# Patient Record
Sex: Female | Born: 1980 | Race: White | Hispanic: No | Marital: Married | Smoking: Never smoker
Health system: Southern US, Community
[De-identification: ages and names within clinical notes are randomized; demographics above are authoritative.]

## PROBLEM LIST (undated history)

## (undated) ENCOUNTER — Inpatient Hospital Stay (HOSPITAL_COMMUNITY): Payer: Self-pay

## (undated) DIAGNOSIS — E282 Polycystic ovarian syndrome: Secondary | ICD-10-CM

## (undated) DIAGNOSIS — F419 Anxiety disorder, unspecified: Secondary | ICD-10-CM

## (undated) DIAGNOSIS — E739 Lactose intolerance, unspecified: Secondary | ICD-10-CM

## (undated) DIAGNOSIS — Z9889 Other specified postprocedural states: Secondary | ICD-10-CM

## (undated) DIAGNOSIS — R112 Nausea with vomiting, unspecified: Secondary | ICD-10-CM

## (undated) HISTORY — DX: Polycystic ovarian syndrome: E28.2

## (undated) HISTORY — DX: Other specified postprocedural states: Z98.890

## (undated) HISTORY — DX: Lactose intolerance, unspecified: E73.9

## (undated) HISTORY — PX: OTHER SURGICAL HISTORY: SHX169

## (undated) HISTORY — DX: Nausea with vomiting, unspecified: R11.2

---

## 2003-06-10 HISTORY — PX: TONSILLECTOMY: SUR1361

## 2005-06-09 HISTORY — PX: BREAST ENHANCEMENT SURGERY: SHX7

## 2007-06-10 HISTORY — PX: CHOLECYSTECTOMY: SHX55

## 2007-06-10 HISTORY — PX: BREAST BIOPSY: SHX20

## 2011-04-14 ENCOUNTER — Ambulatory Visit (INDEPENDENT_AMBULATORY_CARE_PROVIDER_SITE_OTHER): Payer: No Typology Code available for payment source | Admitting: *Deleted

## 2011-04-14 DIAGNOSIS — O30009 Twin pregnancy, unspecified number of placenta and unspecified number of amniotic sacs, unspecified trimester: Secondary | ICD-10-CM

## 2011-04-14 DIAGNOSIS — Z3480 Encounter for supervision of other normal pregnancy, unspecified trimester: Secondary | ICD-10-CM

## 2011-04-14 NOTE — Progress Notes (Signed)
p-88 Pt is referred here for [facility] care from Dr Elesa Hacker.  She underwent 6 cycles of Femara and ultimately conceived on the 6 cycle.  Bedside u/s today showed twins A and B both with FHT.  Baby A measures39.82mm GA [redacted]w[redacted]d and FHT162bpm.  Baby B is 43.36mm GA [redacted]w[redacted]d and FHT 162bpm.  Labs were deferred today due to pending insurance.  She is scheduled to return in 1 week for her prenatal exam.

## 2011-04-25 ENCOUNTER — Ambulatory Visit (INDEPENDENT_AMBULATORY_CARE_PROVIDER_SITE_OTHER): Payer: Medicaid Other | Admitting: Family

## 2011-04-25 ENCOUNTER — Other Ambulatory Visit: Payer: Self-pay | Admitting: Family

## 2011-04-25 ENCOUNTER — Other Ambulatory Visit (HOSPITAL_COMMUNITY)
Admission: RE | Admit: 2011-04-25 | Discharge: 2011-04-25 | Disposition: A | Discharge status: Home / Self Care | Payer: Medicaid Other | Source: Ambulatory Visit | Attending: Obstetrics & Gynecology | Admitting: Obstetrics & Gynecology

## 2011-04-25 ENCOUNTER — Encounter: Payer: Self-pay | Admitting: Family

## 2011-04-25 VITALS — BP 105/70 | Temp 98.6°F | Wt 117.0 lb

## 2011-04-25 DIAGNOSIS — Z113 Encounter for screening for infections with a predominantly sexual mode of transmission: Secondary | ICD-10-CM | POA: Insufficient documentation

## 2011-04-25 DIAGNOSIS — Z1159 Encounter for screening for other viral diseases: Secondary | ICD-10-CM | POA: Insufficient documentation

## 2011-04-25 DIAGNOSIS — Z3490 Encounter for supervision of normal pregnancy, unspecified, unspecified trimester: Secondary | ICD-10-CM

## 2011-04-25 DIAGNOSIS — Z3480 Encounter for supervision of other normal pregnancy, unspecified trimester: Secondary | ICD-10-CM

## 2011-04-25 DIAGNOSIS — Z01419 Encounter for gynecological examination (general) (routine) without abnormal findings: Secondary | ICD-10-CM | POA: Insufficient documentation

## 2011-04-25 NOTE — Progress Notes (Signed)
Concerned with weight gain in pregnancy; only gained 1 lb; wait two weeks if no additional gain will meet with the nutritionalist.  Schedule dating ultrasound. Exam    Uterine Size: Uterus measures 16cm; appropriate for twin gestation  Pelvic Exam:    Perineum: No Hemorrhoids, Normal Perineum   Vulva: normal   Vagina:  normal mucosa, normal discharge, no palpable nodules   pH: Not done   Cervix: no bleeding following Pap, no cervical motion tenderness and no lesions   Adnexa: normal adnexa and no mass, fullness, tenderness   Bony Pelvis: Adequate  System: Breast:  No nipple retraction or dimpling, No nipple discharge or bleeding, No axillary or supraclavicular adenopathy, Normal to palpation without dominant masses   Skin: normal coloration and turgor, no rashes    Neurologic: negative   Extremities: normal strength, tone, and muscle mass   HEENT neck supple with midline trachea and thyroid without masses   Mouth/Teeth mucous membranes moist, pharynx normal without lesions   Neck supple and no masses   Cardiovascular: regular rate and rhythm, no murmurs or gallops   Respiratory:  appears well, vitals normal, no respiratory distress, acyanotic, normal RR, neck free of mass or lymphadenopathy, chest clear, no wheezing, crepitations, rhonchi, normal symmetric air entry   Abdomen: soft, non-tender; bowel sounds normal; no masses,  no organomegaly   Urinary: urethral meatus normal

## 2011-04-25 NOTE — Progress Notes (Signed)
p-82 Pt here for NOB exam  Needs PNL drawn today

## 2011-04-26 LAB — OBSTETRIC PANEL
Hemoglobin: 12.1 g/dL (ref 12.0–15.0)
Hepatitis B Surface Ag: NEGATIVE
Lymphs Abs: 1.2 10*3/uL (ref 0.7–4.0)
Monocytes Relative: 6 % (ref 3–12)
Neutro Abs: 6.5 10*3/uL (ref 1.7–7.7)
Neutrophils Relative %: 79 % — ABNORMAL HIGH (ref 43–77)
Platelets: 235 10*3/uL (ref 150–400)
RBC: 3.81 MIL/uL — ABNORMAL LOW (ref 3.87–5.11)
Rh Type: POSITIVE
WBC: 8.2 10*3/uL (ref 4.0–10.5)

## 2011-04-26 LAB — PROGESTERONE: Progesterone: 75.3 ng/mL

## 2011-04-27 LAB — CULTURE, URINE COMPREHENSIVE: Colony Count: 10000

## 2011-04-29 ENCOUNTER — Other Ambulatory Visit: Payer: Self-pay | Admitting: Family

## 2011-04-29 ENCOUNTER — Ambulatory Visit (HOSPITAL_COMMUNITY)
Admission: RE | Admit: 2011-04-29 | Discharge: 2011-04-29 | Disposition: A | Discharge status: Home / Self Care | Payer: Medicaid Other | Source: Ambulatory Visit | Attending: Obstetrics & Gynecology | Admitting: Obstetrics & Gynecology

## 2011-04-29 DIAGNOSIS — Z3689 Encounter for other specified antenatal screening: Secondary | ICD-10-CM | POA: Insufficient documentation

## 2011-04-29 DIAGNOSIS — Z3490 Encounter for supervision of normal pregnancy, unspecified, unspecified trimester: Secondary | ICD-10-CM

## 2011-04-29 DIAGNOSIS — O4400 Placenta previa specified as without hemorrhage, unspecified trimester: Secondary | ICD-10-CM | POA: Insufficient documentation

## 2011-05-23 ENCOUNTER — Encounter: Payer: Self-pay | Admitting: Family

## 2011-05-23 ENCOUNTER — Ambulatory Visit (INDEPENDENT_AMBULATORY_CARE_PROVIDER_SITE_OTHER): Payer: Medicaid Other | Admitting: Family

## 2011-05-23 DIAGNOSIS — O4400 Placenta previa specified as without hemorrhage, unspecified trimester: Secondary | ICD-10-CM

## 2011-05-23 DIAGNOSIS — O30049 Twin pregnancy, dichorionic/diamniotic, unspecified trimester: Secondary | ICD-10-CM

## 2011-05-23 NOTE — Progress Notes (Signed)
Pt with di/di twins; no problems or concerns; reviewed ultrasound with her (low lying placenta versus previa twin B); denies any vaginal bleeding; recommended no intercourse until reeval and to report any bleeding ASAP.  Schedule anatomy US in two weeks.

## 2011-05-23 NOTE — Progress Notes (Signed)
p-86 Pt does have twins and already noticing that she is out of breath when walking. Small spot of blood after intercourse.

## 2011-05-26 ENCOUNTER — Telehealth: Payer: Self-pay | Admitting: *Deleted

## 2011-05-26 NOTE — Telephone Encounter (Signed)
Pt was offered and declined any genetic screening.

## 2011-06-10 NOTE — L&D Delivery Note (Signed)
  Jillian Mcguire, Jillian Mcguire [[id number]]  Delivery Note At 12:32 PM a viable female was delivered via Vaginal, Spontaneous Delivery (Presentation: Left Occiput Anterior).  APGAR: 9, 9; weight 6 lb 10.5 oz (3019 g).   Placenta status: still in utero when pt tx to OR  Cord: 3 vessels  After del of Twin A, vtx verified by bedside US (Dr Debroah Loop). Soon afterwards pt feeling some vag pressure. With pushing, her membranes rupt spontaneously for clear fluid. Initially there was no cord palpated, but with the subsequent ctx there was a fetal heart rate drop verified (using pulse ox on pt), and at that point a loop of cord was palpated and Dr Debroah Loop to eval.   Anesthesia: Epidural  Episiotomy: none Lacerations: 2nd degree;Perineal Suture Repair: see note by Dr Delanna Ahmadi. Blood Loss (mL): 900 (300cc in room with del of Twin A)  Mom to OR.  Baby to nursery-stable.  Cam Hai 10/07/2011, 2:24 PM

## 2011-06-11 ENCOUNTER — Ambulatory Visit (HOSPITAL_COMMUNITY)
Admission: RE | Admit: 2011-06-11 | Discharge: 2011-06-11 | Disposition: A | Discharge status: Home / Self Care | Payer: Medicaid Other | Source: Ambulatory Visit | Attending: Obstetrics and Gynecology | Admitting: Obstetrics and Gynecology

## 2011-06-11 DIAGNOSIS — O358XX0 Maternal care for other (suspected) fetal abnormality and damage, not applicable or unspecified: Secondary | ICD-10-CM | POA: Insufficient documentation

## 2011-06-11 DIAGNOSIS — O4400 Placenta previa specified as without hemorrhage, unspecified trimester: Secondary | ICD-10-CM | POA: Insufficient documentation

## 2011-06-11 DIAGNOSIS — O34219 Maternal care for unspecified type scar from previous cesarean delivery: Secondary | ICD-10-CM | POA: Insufficient documentation

## 2011-06-11 DIAGNOSIS — O30049 Twin pregnancy, dichorionic/diamniotic, unspecified trimester: Secondary | ICD-10-CM

## 2011-06-11 DIAGNOSIS — Z1389 Encounter for screening for other disorder: Secondary | ICD-10-CM | POA: Insufficient documentation

## 2011-06-11 DIAGNOSIS — Z363 Encounter for antenatal screening for malformations: Secondary | ICD-10-CM | POA: Insufficient documentation

## 2011-06-11 DIAGNOSIS — O30009 Twin pregnancy, unspecified number of placenta and unspecified number of amniotic sacs, unspecified trimester: Secondary | ICD-10-CM | POA: Insufficient documentation

## 2011-06-17 ENCOUNTER — Ambulatory Visit (HOSPITAL_COMMUNITY): Payer: Medicaid Other

## 2011-06-18 ENCOUNTER — Ambulatory Visit (HOSPITAL_COMMUNITY): Payer: Medicaid Other

## 2011-06-20 ENCOUNTER — Ambulatory Visit (INDEPENDENT_AMBULATORY_CARE_PROVIDER_SITE_OTHER): Payer: Medicaid Other | Admitting: Physician Assistant

## 2011-06-20 ENCOUNTER — Other Ambulatory Visit: Payer: Self-pay | Admitting: Physician Assistant

## 2011-06-20 ENCOUNTER — Encounter: Payer: Self-pay | Admitting: Physician Assistant

## 2011-06-20 DIAGNOSIS — IMO0002 Reserved for concepts with insufficient information to code with codable children: Secondary | ICD-10-CM

## 2011-06-20 DIAGNOSIS — IMO0001 Reserved for inherently not codable concepts without codable children: Secondary | ICD-10-CM

## 2011-06-20 DIAGNOSIS — O30049 Twin pregnancy, dichorionic/diamniotic, unspecified trimester: Secondary | ICD-10-CM

## 2011-06-20 DIAGNOSIS — O350XX0 Maternal care for (suspected) central nervous system malformation in fetus, not applicable or unspecified: Secondary | ICD-10-CM

## 2011-06-20 DIAGNOSIS — O3503X0 Maternal care for (suspected) central nervous system malformation or damage in fetus, choroid plexus cysts, not applicable or unspecified: Secondary | ICD-10-CM

## 2011-06-20 NOTE — Patient Instructions (Signed)
Breastfeeding, Twins or Multiples Mothers of twins or multiples might feel overwhelmed with the idea of breastfeeding more than one baby at a time. It is easier and less expensive to breastfeed twins than to bottle feed them. This is because you do not need to buy infant formula, wash bottles, buy mild soap, and fill the bottles for more than one baby when it is time to feed them. Human milk is especially important for twins, who are often small at birth and need all the advantages breast milk can provide. Breastfeeding also helps create a unique and special bond between the mother and each of her infants.  Mothers of multiples get more benefits from breastfeeding:  Your uterus contracts and returns to its original size faster. This is helpful because it has stretched even more than normal to hold more than one baby.   Hormones are released that relax the mother. This is helpful with the added stress of caring for more than one infant.   The mother often finds she is saving herself time and money, because there is no need to prepare formula or bottles. Your milk is available whenever your babies are ready to feed, at the right temperature, providing optimal nutrition.  If the babies are premature and unable to nurse, you can pump your breasts and freeze the milk until the babies are ready to feed at the breast. To stimulate a milk supply, your breasts need to be emptied at least 8 to 10 times in a 24 hour period. Ask a lactation specialist to help you choose an effective breast pump and to provide guidance in helping your babies latch onto, and feed from, the breast when they are ready. TO GET STARTED: Nurse as soon as possible after birth, and as often as the babies want to do so. This will stimulate your breasts to produce adequate amounts of milk. Mothers of twins almost always produce enough milk for both babies.  TIPS TO INCREASE SUCCESS:  Many mothers of multiples find it easiest to nurse the  babies together. However, if one of the infants is having difficulty latching or sucking, the mother may need to give that baby her full attention when it is time to feed.   Nursing two babies at the same time often gets easier as the babies get older and more experienced at latching onto the breast. Extra pillows under the mother's arms, legs, and under the babies can help this process.   Breastfeeding two babies at once may increase the mother's milk producing hormone (prolactin) levels, and boost her milk production. The more often the babies breastfeed effectively, the more milk the mother will produce.   Switch the babies from one side to the other at alternate feedings. For instance, if baby A feeds from the right breast and baby B feeds from the left breast, then at the next feeding, baby A should take the left breast and baby B the right breast. This ensures that both breasts get equal amounts of stimulation. It also uses the stronger sucking twin to increase the milk supply for the twin whose suck is weaker.   If one of the babies is having difficulty feeding, it may help to try breastfeeding him at the same time as his sibling. The baby with the stronger or more effective suck will stimulate the mother's milk to flow faster. This will encourage his twin to suck and swallow correctly.   It is important to avoid limiting the amount of time   each baby spends feeding at the breast. This allows both babies to obtain the fattier milk that is available at the end of the feeding, when the breast is emptier.   Avoiding bottles and pacifiers during the early weeks will encourage effective sucking patterns and help establish a good milk supply. You should not need supplements if you empty your breasts with each feeding.   A good latch for both infants is important in helping the babies empty the breast effectively, and for avoiding sore nipples. The most common cause of soreness is improper latch-on and  positioning.   In the early days, keep track of each infant's stools and wet diapers, to make sure each baby is getting enough milk. In the first 6 weeks, each baby should have 6 to 8 wet cloth diapers (5 to 6 disposable diapers) and 2 or more bowel movements per day.   There are several positions and holds that make it easier to nurse more than one baby at a time. Ask your nurse or lactation specialist to suggest tips on positioning.   If you know you are having twins, talk with a lactation consultant about more ways you can increase your success at breastfeeding.  Document Released: 09/23/2004 Document Revised: 02/05/2011 Document Reviewed: 04/12/2009 [employer] Patient Information 2012 [employer]. 

## 2011-06-20 NOTE — Progress Notes (Signed)
Discussed [place] results. Previa resolved on baby B. Baby B with BL CPC--->referral made to MFM for re-check

## 2011-06-20 NOTE — Progress Notes (Signed)
Declines QUAD screen

## 2011-06-20 NOTE — Progress Notes (Signed)
p-100  Having URI symptoms.  Feels like she is having symptoms of low blood sugar

## 2011-07-02 ENCOUNTER — Ambulatory Visit (HOSPITAL_COMMUNITY): Payer: Medicaid Other

## 2011-07-02 ENCOUNTER — Encounter (HOSPITAL_COMMUNITY): Payer: Self-pay

## 2011-07-02 ENCOUNTER — Ambulatory Visit (HOSPITAL_COMMUNITY)
Admission: RE | Admit: 2011-07-02 | Discharge: 2011-07-02 | Disposition: A | Discharge status: Home / Self Care | Payer: Medicaid Other | Source: Ambulatory Visit | Attending: Physician Assistant | Admitting: Physician Assistant

## 2011-07-02 DIAGNOSIS — IMO0001 Reserved for inherently not codable concepts without codable children: Secondary | ICD-10-CM

## 2011-07-02 DIAGNOSIS — O350XX0 Maternal care for (suspected) central nervous system malformation in fetus, not applicable or unspecified: Secondary | ICD-10-CM | POA: Insufficient documentation

## 2011-07-02 DIAGNOSIS — O30009 Twin pregnancy, unspecified number of placenta and unspecified number of amniotic sacs, unspecified trimester: Secondary | ICD-10-CM | POA: Insufficient documentation

## 2011-07-02 DIAGNOSIS — O3500X0 Maternal care for (suspected) central nervous system malformation or damage in fetus, unspecified, not applicable or unspecified: Secondary | ICD-10-CM | POA: Insufficient documentation

## 2011-07-02 DIAGNOSIS — O34219 Maternal care for unspecified type scar from previous cesarean delivery: Secondary | ICD-10-CM | POA: Insufficient documentation

## 2011-07-11 ENCOUNTER — Ambulatory Visit (INDEPENDENT_AMBULATORY_CARE_PROVIDER_SITE_OTHER): Payer: Medicaid Other | Admitting: Advanced Practice Midwife

## 2011-07-11 VITALS — BP 98/60 | Temp 98.6°F | Wt 136.0 lb

## 2011-07-11 DIAGNOSIS — O0990 Supervision of high risk pregnancy, unspecified, unspecified trimester: Secondary | ICD-10-CM

## 2011-07-11 DIAGNOSIS — O350XX0 Maternal care for (suspected) central nervous system malformation in fetus, not applicable or unspecified: Secondary | ICD-10-CM

## 2011-07-11 DIAGNOSIS — O30049 Twin pregnancy, dichorionic/diamniotic, unspecified trimester: Secondary | ICD-10-CM

## 2011-07-11 DIAGNOSIS — IMO0002 Reserved for concepts with insufficient information to code with codable children: Secondary | ICD-10-CM

## 2011-07-11 NOTE — Progress Notes (Signed)
Few BH. [place] on 07/02/11 CP cyst resolved. 16% discordance. Normal fluid and interval growth. CL 3.1 cm. F/U growth [place] in 4 weeks. 1 hour GTT at [facility].

## 2011-07-11 NOTE — Patient Instructions (Signed)
Pregnancy - Second Trimester The second trimester of pregnancy (3 to 6 months) is a period of rapid growth for you and your baby. At the end of the sixth month, your baby is about 9 inches long and weighs 1 1/2 pounds. You will begin to feel the baby move between 18 and 20 weeks of the pregnancy. This is called quickening. Weight gain is faster. A clear fluid (colostrum) may leak out of your breasts. You may feel small contractions of the womb (uterus). This is known as false labor or Braxton-Hicks contractions. This is like a practice for labor when the baby is ready to be born. Usually, the problems with morning sickness have usually passed by the end of your first trimester. Some women develop small dark blotches (called cholasma, mask of pregnancy) on their face that usually goes away after the baby is born. Exposure to the sun makes the blotches worse. Acne may also develop in some pregnant women and pregnant women who have acne, may find that it goes away. PRENATAL EXAMS  Blood work may continue to be done during prenatal exams. These tests are done to check on your health and the probable health of your baby. Blood work is used to follow your blood levels (hemoglobin). Anemia (low hemoglobin) is common during pregnancy. Iron and vitamins are given to help prevent this. You will also be checked for diabetes between 24 and 28 weeks of the pregnancy. Some of the previous blood tests may be repeated.   The size of the uterus is measured during each visit. This is to make sure that the baby is continuing to grow properly according to the dates of the pregnancy.   Your blood pressure is checked every prenatal visit. This is to make sure you are not getting toxemia.   Your urine is checked to make sure you do not have an infection, diabetes or protein in the urine.   Your weight is checked often to make sure gains are happening at the suggested rate. This is to ensure that both you and your baby are  growing normally.   Sometimes, an ultrasound is performed to confirm the proper growth and development of the baby. This is a test which bounces harmless sound waves off the baby so your caregiver can more accurately determine due dates.  Sometimes, a specialized test is done on the amniotic fluid surrounding the baby. This test is called an amniocentesis. The amniotic fluid is obtained by sticking a needle into the belly (abdomen). This is done to check the chromosomes in instances where there is a concern about possible genetic problems with the baby. It is also sometimes done near the end of pregnancy if an early delivery is required. In this case, it is done to help make sure the baby's lungs are mature enough for the baby to live outside of the womb. CHANGES OCCURING IN THE SECOND TRIMESTER OF PREGNANCY Your body goes through many changes during pregnancy. They vary from person to person. Talk to your caregiver about changes you notice that you are concerned about.  During the second trimester, you will likely have an increase in your appetite. It is normal to have cravings for certain foods. This varies from person to person and pregnancy to pregnancy.   Your lower abdomen will begin to bulge.   You may have to urinate more often because the uterus and baby are pressing on your bladder. It is also common to get more bladder infections during pregnancy (  pain with urination). You can help this by drinking lots of fluids and emptying your bladder before and after intercourse.   You may begin to get stretch marks on your hips, abdomen, and breasts. These are normal changes in the body during pregnancy. There are no exercises or medications to take that prevent this change.   You may begin to develop swollen and bulging veins (varicose veins) in your legs. Wearing support hose, elevating your feet for 15 minutes, 3 to 4 times a day and limiting salt in your diet helps lessen the problem.    Heartburn may develop as the uterus grows and pushes up against the stomach. Antacids recommended by your caregiver helps with this problem. Also, eating smaller meals 4 to 5 times a day helps.   Constipation can be treated with a stool softener or adding bulk to your diet. Drinking lots of fluids, vegetables, fruits, and whole grains are helpful.   Exercising is also helpful. If you have been very active up until your pregnancy, most of these activities can be continued during your pregnancy. If you have been less active, it is helpful to start an exercise program such as walking.   Hemorrhoids (varicose veins in the rectum) may develop at the end of the second trimester. Warm sitz baths and hemorrhoid cream recommended by your caregiver helps hemorrhoid problems.   Backaches may develop during this time of your pregnancy. Avoid heavy lifting, wear low heal shoes and practice good posture to help with backache problems.   Some pregnant women develop tingling and numbness of their hand and fingers because of swelling and tightening of ligaments in the wrist (carpel tunnel syndrome). This goes away after the baby is born.   As your breasts enlarge, you may have to get a bigger bra. Get a comfortable, cotton, support bra. Do not get a nursing bra until the last month of the pregnancy if you will be nursing the baby.   You may get a dark line from your belly button to the pubic area called the linea nigra.   You may develop rosy cheeks because of increase blood flow to the face.   You may develop spider looking lines of the face, neck, arms and chest. These go away after the baby is born.  HOME CARE INSTRUCTIONS   It is extremely important to avoid all smoking, herbs, alcohol, and unprescribed drugs during your pregnancy. These chemicals affect the formation and growth of the baby. Avoid these chemicals throughout the pregnancy to ensure the delivery of a healthy infant.   Most of your home  care instructions are the same as suggested for the first trimester of your pregnancy. Keep your caregiver's appointments. Follow your caregiver's instructions regarding medication use, exercise and diet.   During pregnancy, you are providing food for you and your baby. Continue to eat regular, well-balanced meals. Choose foods such as meat, fish, milk and other low fat dairy products, vegetables, fruits, and whole-grain breads and cereals. Your caregiver will tell you of the ideal weight gain.   A physical sexual relationship may be continued up until near the end of pregnancy if there are no other problems. Problems could include early (premature) leaking of amniotic fluid from the membranes, vaginal bleeding, abdominal pain, or other medical or pregnancy problems.   Exercise regularly if there are no restrictions. Check with your caregiver if you are unsure of the safety of some of your exercises. The greatest weight gain will occur in the   last 2 trimesters of pregnancy. Exercise will help you:   Control your weight.   Get you in shape for labor and delivery.   Lose weight after you have the baby.   Wear a good support or jogging bra for breast tenderness during pregnancy. This may help if worn during sleep. Pads or tissues may be used in the bra if you are leaking colostrum.   Do not use hot tubs, steam rooms or saunas throughout the pregnancy.   Wear your seat belt at all times when driving. This protects you and your baby if you are in an accident.   Avoid raw meat, uncooked cheese, cat litter boxes and soil used by cats. These carry germs that can cause birth defects in the baby.   The second trimester is also a good time to visit your dentist for your dental health if this has not been done yet. Getting your teeth cleaned is OK. Use a soft toothbrush. Brush gently during pregnancy.   It is easier to loose urine during pregnancy. Tightening up and strengthening the pelvic muscles will  help with this problem. Practice stopping your urination while you are going to the bathroom. These are the same muscles you need to strengthen. It is also the muscles you would use as if you were trying to stop from passing gas. You can practice tightening these muscles up 10 times a set and repeating this about 3 times per day. Once you know what muscles to tighten up, do not perform these exercises during urination. It is more likely to contribute to an infection by backing up the urine.   Ask for help if you have financial, counseling or nutritional needs during pregnancy. Your caregiver will be able to offer counseling for these needs as well as refer you for other special needs.   Your skin may become oily. If so, wash your face with mild soap, use non-greasy moisturizer and oil or cream based makeup.  MEDICATIONS AND DRUG USE IN PREGNANCY  Take prenatal vitamins as directed. The vitamin should contain 1 milligram of folic acid. Keep all vitamins out of reach of children. Only a couple vitamins or tablets containing iron may be fatal to a baby or young child when ingested.   Avoid use of all medications, including herbs, over-the-counter medications, not prescribed or suggested by your caregiver. Only take over-the-counter or prescription medicines for pain, discomfort, or fever as directed by your caregiver. Do not use aspirin.   Let your caregiver also know about herbs you may be using.   Alcohol is related to a number of birth defects. This includes fetal alcohol syndrome. All alcohol, in any form, should be avoided completely. Smoking will cause low birth rate and premature babies.   Street or illegal drugs are very harmful to the baby. They are absolutely forbidden. A baby born to an addicted mother will be addicted at birth. The baby will go through the same withdrawal an adult does.  SEEK MEDICAL CARE IF:  You have any concerns or worries during your pregnancy. It is better to call with  your questions if you feel they cannot wait, rather than worry about them. SEEK IMMEDIATE MEDICAL CARE IF:   An unexplained oral temperature above 102 F (38.9 C) develops, or as your caregiver suggests.   You have leaking of fluid from the vagina (birth canal). If leaking membranes are suspected, take your temperature and tell your caregiver of this when you call.   There   is vaginal spotting, bleeding, or passing clots. Tell your caregiver of the amount and how many pads are used. Light spotting in pregnancy is common, especially following intercourse.   You develop a bad smelling vaginal discharge with a change in the color from clear to white.   You continue to feel sick to your stomach (nauseated) and have no relief from remedies suggested. You vomit blood or coffee ground-like materials.   You lose more than 2 pounds of weight or gain more than 2 pounds of weight over 1 week, or as suggested by your caregiver.   You notice swelling of your face, hands, feet, or legs.   You get exposed to [place] measles and have never had them.   You are exposed to fifth disease or chickenpox.   You develop belly (abdominal) pain. Round ligament discomfort is a common non-cancerous (benign) cause of abdominal pain in pregnancy. Your caregiver still must evaluate you.   You develop a bad headache that does not go away.   You develop fever, diarrhea, pain with urination, or shortness of breath.   You develop visual problems, blurry, or double vision.   You fall or are in a car accident or any kind of trauma.   There is mental or physical violence at home.  Document Released: 05/20/2001 Document Revised: 02/05/2011 Document Reviewed: 11/22/2008 [facility] Patient Information 2012 [place], [place].Preventing Preterm Labor Preterm labor is when a pregnant woman has contractions that cause the cervix to open, shorten, and thin before 37 weeks of pregnancy. You will have regular contractions (tightening)  2 to 3 minutes apart. This usually causes discomfort or pain. HOME CARE  Eat a healthy diet.   Take your vitamins as told by your doctor.   Drink enough fluids to keep your pee (urine) clear or pale yellow every day.   Get rest and sleep.   Do not have sex if you are at high risk for preterm labor.   Follow your doctor's advice about activity, medicines, and tests.   Avoid stress.   Avoid hard labor or exercise that lasts for a long time.   Do not smoke.  GET HELP RIGHT AWAY IF:   You are having contractions.   You have belly (abdominal) pain.   You have bleeding from your vagina.   You have pain when you pee (urinate).   You have abnormal discharge from your vagina.   You have a temperature by mouth above 102 F (38.9 C).  MAKE SURE YOU:  Understand these instructions.   Will watch your condition.   Will get help if you are not doing well or get worse.  Document Released: 08/22/2008 Document Revised: 02/05/2011 Document Reviewed: 08/22/2008 [facility] Patient Information 2012 [place], [place].

## 2011-07-11 NOTE — Progress Notes (Signed)
p=105 

## 2011-07-15 DIAGNOSIS — O0990 Supervision of high risk pregnancy, unspecified, unspecified trimester: Secondary | ICD-10-CM | POA: Insufficient documentation

## 2011-07-22 ENCOUNTER — Ambulatory Visit (INDEPENDENT_AMBULATORY_CARE_PROVIDER_SITE_OTHER): Payer: Medicaid Other | Admitting: Obstetrics & Gynecology

## 2011-07-22 DIAGNOSIS — O30049 Twin pregnancy, dichorionic/diamniotic, unspecified trimester: Secondary | ICD-10-CM

## 2011-07-22 DIAGNOSIS — O350XX0 Maternal care for (suspected) central nervous system malformation in fetus, not applicable or unspecified: Secondary | ICD-10-CM

## 2011-07-22 DIAGNOSIS — O0990 Supervision of high risk pregnancy, unspecified, unspecified trimester: Secondary | ICD-10-CM

## 2011-07-22 DIAGNOSIS — IMO0002 Reserved for concepts with insufficient information to code with codable children: Secondary | ICD-10-CM

## 2011-07-22 NOTE — Progress Notes (Signed)
Came in today for increased contractions, no LOF, VB.  Good FM x 2.  Cervix closed/long/thick/posterior, patient reassured.  No other complaints or concerns.  Fetal movement and labor precautions reviewed.  Return to clinic in 3 weeks, will get 1 hr GTT and third trimester labs at that visit.  Next ultrasound is on 07/29/10; twins were concordant on last scan on 07/03/11 (16% discordant).  Plans to breast feed, no BCM (had infertility treatment for both pregnancies).  Understands that spontaneous conception can occur in patients with infertility problems; still declines PPBCM.

## 2011-07-22 NOTE — Progress Notes (Signed)
p=90 

## 2011-07-22 NOTE — Patient Instructions (Addendum)
Return to clinic for any obstetric concerns or go to MAU or ED for evaluation

## 2011-07-30 ENCOUNTER — Ambulatory Visit (HOSPITAL_COMMUNITY)
Admission: RE | Admit: 2011-07-30 | Discharge: 2011-07-30 | Disposition: A | Discharge status: Home / Self Care | Payer: Medicaid Other | Source: Ambulatory Visit | Attending: Obstetrics & Gynecology | Admitting: Obstetrics & Gynecology

## 2011-07-30 ENCOUNTER — Other Ambulatory Visit: Payer: Self-pay | Admitting: Obstetrics & Gynecology

## 2011-07-30 DIAGNOSIS — O350XX0 Maternal care for (suspected) central nervous system malformation in fetus, not applicable or unspecified: Secondary | ICD-10-CM | POA: Insufficient documentation

## 2011-07-30 DIAGNOSIS — O30009 Twin pregnancy, unspecified number of placenta and unspecified number of amniotic sacs, unspecified trimester: Secondary | ICD-10-CM | POA: Insufficient documentation

## 2011-07-30 DIAGNOSIS — IMO0001 Reserved for inherently not codable concepts without codable children: Secondary | ICD-10-CM

## 2011-07-30 DIAGNOSIS — O34219 Maternal care for unspecified type scar from previous cesarean delivery: Secondary | ICD-10-CM | POA: Insufficient documentation

## 2011-07-30 DIAGNOSIS — O3500X0 Maternal care for (suspected) central nervous system malformation or damage in fetus, unspecified, not applicable or unspecified: Secondary | ICD-10-CM | POA: Insufficient documentation

## 2011-08-08 ENCOUNTER — Ambulatory Visit (INDEPENDENT_AMBULATORY_CARE_PROVIDER_SITE_OTHER): Payer: Medicaid Other | Admitting: Physician Assistant

## 2011-08-08 VITALS — BP 98/62 | Temp 98.6°F | Wt 144.0 lb

## 2011-08-08 DIAGNOSIS — Z3480 Encounter for supervision of other normal pregnancy, unspecified trimester: Secondary | ICD-10-CM

## 2011-08-08 DIAGNOSIS — O34219 Maternal care for unspecified type scar from previous cesarean delivery: Secondary | ICD-10-CM

## 2011-08-08 DIAGNOSIS — O30009 Twin pregnancy, unspecified number of placenta and unspecified number of amniotic sacs, unspecified trimester: Secondary | ICD-10-CM

## 2011-08-08 DIAGNOSIS — O99820 Streptococcus B carrier state complicating pregnancy: Secondary | ICD-10-CM | POA: Insufficient documentation

## 2011-08-08 DIAGNOSIS — O30049 Twin pregnancy, dichorionic/diamniotic, unspecified trimester: Secondary | ICD-10-CM

## 2011-08-08 DIAGNOSIS — B951 Streptococcus, group B, as the cause of diseases classified elsewhere: Secondary | ICD-10-CM

## 2011-08-08 NOTE — Progress Notes (Signed)
p-99 

## 2011-08-08 NOTE — Patient Instructions (Signed)

## 2011-08-08 NOTE — Progress Notes (Signed)
UI continues. Stops w/ rest. 28 week labs drawn. A pos. Desires VBAC. R/B/I of VBAC vs RLTCS discussed. Pt understands that she is at higher risk of needing C/S due to twins. Info given. Needs to sign consent. [place] 2/20. Appropriate interval growth (80%, 66%), concordant(14%). Vtx/vtx. CL 3.2 cm. F/U US in 4 weeks.

## 2011-08-09 LAB — CBC
Hemoglobin: 10.8 g/dL — ABNORMAL LOW (ref 12.0–15.0)
MCH: 32.4 pg (ref 26.0–34.0)
MCHC: 32.1 g/dL (ref 30.0–36.0)
Platelets: 187 10*3/uL (ref 150–400)

## 2011-08-09 LAB — HIV ANTIBODY (ROUTINE TESTING W REFLEX): HIV: NONREACTIVE

## 2011-08-09 LAB — GLUCOSE TOLERANCE, 1 HOUR: Glucose, 1 Hour GTT: 113 mg/dL (ref 70–140)

## 2011-08-09 LAB — RPR

## 2011-08-22 ENCOUNTER — Ambulatory Visit (INDEPENDENT_AMBULATORY_CARE_PROVIDER_SITE_OTHER): Payer: Medicaid Other | Admitting: Advanced Practice Midwife

## 2011-08-22 VITALS — BP 96/60 | Temp 98.5°F | Wt 144.0 lb

## 2011-08-22 DIAGNOSIS — Z733 Stress, not elsewhere classified: Secondary | ICD-10-CM

## 2011-08-22 DIAGNOSIS — F439 Reaction to severe stress, unspecified: Secondary | ICD-10-CM

## 2011-08-22 DIAGNOSIS — Z3480 Encounter for supervision of other normal pregnancy, unspecified trimester: Secondary | ICD-10-CM

## 2011-08-22 NOTE — Progress Notes (Signed)
p-99  Has had 3 deaths in family in 2 weeks.  Pt having some issues with hyperventilation.  Pt also having a feeling of either heart working harder or breathing when she lies down at night even if she props herself up.

## 2011-08-22 NOTE — Patient Instructions (Signed)

## 2011-08-22 NOTE — Progress Notes (Signed)
Lots of stress. Discussed coping mechanisms.  Really wants delayed cord clamping whether Vag or C/S.  Discussed logistics. Pt will remind provider at the time and will need to discuss with MD if C/S (not promised).  Also really wants to avoid Induction, even at the cost of C/S, since IOL was a problem last time.  Has [place] scheduled on Wed

## 2011-08-27 ENCOUNTER — Ambulatory Visit (HOSPITAL_COMMUNITY)
Admission: RE | Admit: 2011-08-27 | Discharge: 2011-08-27 | Disposition: A | Discharge status: Home / Self Care | Payer: Medicaid Other | Source: Ambulatory Visit | Attending: Advanced Practice Midwife | Admitting: Advanced Practice Midwife

## 2011-08-27 VITALS — BP 102/66 | HR 100 | Wt 148.0 lb

## 2011-08-27 DIAGNOSIS — O3500X0 Maternal care for (suspected) central nervous system malformation or damage in fetus, unspecified, not applicable or unspecified: Secondary | ICD-10-CM | POA: Insufficient documentation

## 2011-08-27 DIAGNOSIS — O30009 Twin pregnancy, unspecified number of placenta and unspecified number of amniotic sacs, unspecified trimester: Secondary | ICD-10-CM | POA: Insufficient documentation

## 2011-08-27 DIAGNOSIS — O350XX0 Maternal care for (suspected) central nervous system malformation in fetus, not applicable or unspecified: Secondary | ICD-10-CM | POA: Insufficient documentation

## 2011-08-27 DIAGNOSIS — IMO0001 Reserved for inherently not codable concepts without codable children: Secondary | ICD-10-CM

## 2011-08-27 DIAGNOSIS — O34219 Maternal care for unspecified type scar from previous cesarean delivery: Secondary | ICD-10-CM | POA: Insufficient documentation

## 2011-09-07 ENCOUNTER — Inpatient Hospital Stay (HOSPITAL_COMMUNITY)
Admission: AD | Admit: 2011-09-07 | Discharge: 2011-09-07 | Disposition: A | Discharge status: Hospice | Payer: Medicaid Other | Source: Ambulatory Visit | Attending: Obstetrics & Gynecology | Admitting: Obstetrics & Gynecology

## 2011-09-07 ENCOUNTER — Other Ambulatory Visit: Payer: Self-pay

## 2011-09-07 ENCOUNTER — Encounter (HOSPITAL_COMMUNITY): Payer: Self-pay | Admitting: *Deleted

## 2011-09-07 DIAGNOSIS — O99891 Other specified diseases and conditions complicating pregnancy: Secondary | ICD-10-CM | POA: Insufficient documentation

## 2011-09-07 DIAGNOSIS — Z22330 Carrier of Group B streptococcus: Secondary | ICD-10-CM | POA: Insufficient documentation

## 2011-09-07 DIAGNOSIS — O30049 Twin pregnancy, dichorionic/diamniotic, unspecified trimester: Secondary | ICD-10-CM | POA: Insufficient documentation

## 2011-09-07 DIAGNOSIS — R0602 Shortness of breath: Secondary | ICD-10-CM | POA: Insufficient documentation

## 2011-09-07 DIAGNOSIS — O34219 Maternal care for unspecified type scar from previous cesarean delivery: Secondary | ICD-10-CM

## 2011-09-07 DIAGNOSIS — O2390 Unspecified genitourinary tract infection in pregnancy, unspecified trimester: Secondary | ICD-10-CM

## 2011-09-07 DIAGNOSIS — O26899 Other specified pregnancy related conditions, unspecified trimester: Secondary | ICD-10-CM

## 2011-09-07 DIAGNOSIS — O99820 Streptococcus B carrier state complicating pregnancy: Secondary | ICD-10-CM

## 2011-09-07 DIAGNOSIS — O30009 Twin pregnancy, unspecified number of placenta and unspecified number of amniotic sacs, unspecified trimester: Secondary | ICD-10-CM

## 2011-09-07 DIAGNOSIS — O0990 Supervision of high risk pregnancy, unspecified, unspecified trimester: Secondary | ICD-10-CM

## 2011-09-07 HISTORY — DX: Anxiety disorder, unspecified: F41.9

## 2011-09-07 LAB — URINALYSIS, ROUTINE W REFLEX MICROSCOPIC
Bilirubin Urine: NEGATIVE
Ketones, ur: NEGATIVE mg/dL
Leukocytes, UA: NEGATIVE
Nitrite: NEGATIVE
Protein, ur: NEGATIVE mg/dL
Urobilinogen, UA: 0.2 mg/dL (ref 0.0–1.0)
pH: 6 (ref 5.0–8.0)

## 2011-09-07 NOTE — MAU Note (Signed)
Pt reports feeling  SOB through out her pregnancy but has gotten worse over the past  24 hrs. Pt stated she felt SOB when she was just sitting doing nothing. Has felt dizzy on and off as well. Feels her heart is racing and palpitations at times.

## 2011-09-07 NOTE — MAU Provider Note (Signed)
Spoke with Dr. Marice Potter regarding POC. EKG normal. Dr. Marice Potter recommended to pt discuss symptoms with provider tomorrow and get cardiac consult to evaluate pt.

## 2011-09-07 NOTE — Discharge Instructions (Signed)
Shortness of Breath Shortness of breath (dyspnea) is the feeling of uneasy breathing. Shortness of breath does not always mean that there is a life-threatening illness. However, shortness of breath requires immediate medical care. CAUSES  Causes for shortness of breath include:  Not enough oxygen in the air (as with high altitudes or with a smoke-filled room).   Short-term (acute) lung disease, including:   Infections such as pneumonia.   Fluid in the lungs, such as heart failure.   A blood clot in the lungs (pulmonary embolism).   Lasting (chronic) lung diseases.   Heart disease (heart attack, angina, heart failure, and others).   Low red blood cells (anemia).   Poor physical fitness. This can cause shortness of breath when you exercise.   Chest or back injuries or stiffness.   Being overweight (obese).   Anxiety. This can make you feel like you are not getting enough air.  DIAGNOSIS  Serious medical problems can usually be found during your physical exam. Many tests may also be done to determine why you are having shortness of breath. Tests include:  Chest X-rays.   Lung function tests.   Blood tests.   Electrocardiography.   Exercise testing.   A cardiac echo.   Imaging scans.  Your caregiver may not be able to find a cause for your shortness of breath after your exam. In this case, it is important to have a follow-up exam with your caregiver as directed.  HOME CARE INSTRUCTIONS   Do not smoke. Smoking is a common cause of shortness of breath. Ask for help to stop smoking.   Avoid being around chemicals that may bother your breathing (paint fumes, dust).   Rest as needed. Slowly resume your usual activities.   If medicines were prescribed, take them as directed for the full length of time directed. This includes oxygen and any inhaled medicines.   Follow up with your caregiver as directed. Waiting to do so or failure to follow up could result in worsening of  your condition and possible disability or death.   Be sure you understand what to do or who to call if your shortness of breath worsens.  SEEK MEDICAL CARE IF:   Your condition does not improve in the time expected.   You have a hard time doing your normal activities even with rest.   You have any side effects or problems with the medicines prescribed.   You develop any new symptoms.  SEEK IMMEDIATE MEDICAL CARE IF:   Your shortness of breath is getting worse.   You feel lightheaded, faint, or develop a cough not controlled with medicines.   You start coughing up blood.   You have pain with breathing.   You have chest pain or pain in your arms, shoulders, or abdomen.   You have a fever.   You are unable to walk up stairs or exercise the way you normally do.   Your symptoms are getting worse.  Document Released: 02/18/2001 Document Revised: 05/15/2011 Document Reviewed: 10/06/2007 [facility] [employer] [facility] Patient Information 2012 [place], [place].

## 2011-09-07 NOTE — MAU Note (Signed)
Pt c/o light headedness, short of breath and palpitations off and on today but mostly at night.

## 2011-09-07 NOTE — MAU Provider Note (Signed)
History     CSN: [id number]  Arrival date & time 09/07/11  1800   None     Chief Complaint  Patient presents with  . Shortness of Breath    (Consider location/radiation/quality/duration/timing/severity/associated sxs/prior treatment) Shortness of Breath This is a chronic problem. The current episode started more than 1 month ago. The problem occurs intermittently. The problem has been gradually worsening.   [name] @ 31 5 with twin gestation in with c/o SOB and tachycardia. She states this has been going on a while but has gotten worse over the past several days.   Past Medical History  Diagnosis Date  . PONV (postoperative nausea and vomiting)     Pt was put under general for CSection due to mal function with epidural  . PCOS (polycystic ovarian syndrome)   . Lactose intolerance   . Anxiety     Past Surgical History  Procedure Date  . Tonsillectomy 2005  . Breast enhancement surgery 2007  . Breast biopsy 2009  . Cholecystectomy 2009  . Cesarean section 2010  . Breast fibroid     Family History  Problem Relation Age of Onset  . Heart disease Mother   . Fibroids Mother   . Endometriosis Mother   . Cancer Maternal Grandmother     ovarian  . Hypertension Maternal Grandfather   . Diabetes Maternal Grandfather   . Cancer Paternal Grandfather     brain tumor  . Cancer Paternal Grandmother     uterine    History  Substance Use Topics  . Smoking status: Never Smoker   . Smokeless tobacco: Never Used  . Alcohol Use: No    OB History    Grav Para Term Preterm Abortions TAB SAB Ect Mult Living   5 1 1  2  0 2   1      Review of Systems  Constitutional: Negative.   HENT: Negative.   Eyes: Negative.   Respiratory: Positive for shortness of breath.   Cardiovascular: Negative.   Gastrointestinal: Negative.   Genitourinary: Negative.   Musculoskeletal: Negative.   Neurological: Negative.   Hematological: Negative.   Psychiatric/Behavioral: Negative.      Allergies  Erythromycin and Flagyl  Home Medications  No current outpatient prescriptions on file.  BP 110/64  Pulse 104  Temp(Src) 97.2 F (36.2 C) (Oral)  Resp 18  Ht 5\' 5"  (1.651 m)  Wt 150 lb 3.2 oz (68.13 kg)  BMI 24.99 kg/m2  SpO2 99%  Breastfeeding? Unknown  Physical Exam  Constitutional: She is oriented to person, place, and time. She appears well-developed and well-nourished.  HENT:  Head: Normocephalic.  Neck: Normal range of motion.  Cardiovascular: Normal rate, regular rhythm, normal heart sounds and intact distal pulses.   Pulmonary/Chest: Effort normal and breath sounds normal.  Abdominal: Soft. Bowel sounds are normal.  Musculoskeletal: Normal range of motion.  Neurological: She is alert and oriented to person, place, and time. She has normal reflexes.  Skin: Skin is warm and dry.  Psychiatric: She has a normal mood and affect. Her behavior is normal. Judgment and thought content normal.    ED Course  Procedures (including critical care time)   Labs Reviewed  URINALYSIS, ROUTINE W REFLEX MICROSCOPIC   No results found.   1. Previous cesarean delivery affecting pregnancy, antepartum   2. Urinary tract colonization by group B Streptococcus complicating pregnancy   3. High-risk pregnancy supervision   4. Twin gestation, dichorionic diamniotic       MDM  EKG and monitor pt. Will also consult with Dr. Marice Potter.

## 2011-09-08 ENCOUNTER — Ambulatory Visit: Payer: Medicaid Other | Admitting: Advanced Practice Midwife

## 2011-09-08 VITALS — BP 101/59 | Temp 96.5°F | Wt 148.0 lb

## 2011-09-08 DIAGNOSIS — O30009 Twin pregnancy, unspecified number of placenta and unspecified number of amniotic sacs, unspecified trimester: Secondary | ICD-10-CM

## 2011-09-08 DIAGNOSIS — R002 Palpitations: Secondary | ICD-10-CM

## 2011-09-08 NOTE — Progress Notes (Signed)
Normal growth on u/s 2 weeks ago, concordant (9%), has f/u in 2 weeks. Seen in MAU yesterday for ongoing, intermittent SOB and heart palpitations, EKG normal, Dr. Marice Potter recommended cardiac consult, referral initiated. Rev'd warning signs and when to call.

## 2011-09-08 NOTE — Progress Notes (Signed)
p-89  Seen in MAU yesterday for SOB and Tachycardia

## 2011-09-19 ENCOUNTER — Ambulatory Visit (INDEPENDENT_AMBULATORY_CARE_PROVIDER_SITE_OTHER): Payer: Medicaid Other | Admitting: Family

## 2011-09-19 VITALS — BP 103/62 | Temp 98.5°F | Wt 150.0 lb

## 2011-09-19 DIAGNOSIS — O0990 Supervision of high risk pregnancy, unspecified, unspecified trimester: Secondary | ICD-10-CM

## 2011-09-19 NOTE — Progress Notes (Signed)
Reports doing well today; decreased stress; [place] with MFM on 09/24/11; +Fetal movement x 2; did not see cardiologist, reports resolution in symptoms, will go if they return; feels "done" with pregnancy due to increased size; reviewed 1 hr results

## 2011-09-19 NOTE — Progress Notes (Signed)
p-89 

## 2011-09-24 ENCOUNTER — Ambulatory Visit (HOSPITAL_COMMUNITY)
Admission: RE | Admit: 2011-09-24 | Discharge: 2011-09-24 | Disposition: A | Discharge status: Home / Self Care | Payer: Medicaid Other | Source: Ambulatory Visit | Attending: Family | Admitting: Family

## 2011-09-24 DIAGNOSIS — O30009 Twin pregnancy, unspecified number of placenta and unspecified number of amniotic sacs, unspecified trimester: Secondary | ICD-10-CM | POA: Insufficient documentation

## 2011-09-24 DIAGNOSIS — O350XX0 Maternal care for (suspected) central nervous system malformation in fetus, not applicable or unspecified: Secondary | ICD-10-CM | POA: Insufficient documentation

## 2011-09-24 DIAGNOSIS — O3500X0 Maternal care for (suspected) central nervous system malformation or damage in fetus, unspecified, not applicable or unspecified: Secondary | ICD-10-CM | POA: Insufficient documentation

## 2011-09-24 DIAGNOSIS — IMO0001 Reserved for inherently not codable concepts without codable children: Secondary | ICD-10-CM

## 2011-09-24 DIAGNOSIS — O34219 Maternal care for unspecified type scar from previous cesarean delivery: Secondary | ICD-10-CM | POA: Insufficient documentation

## 2011-09-26 ENCOUNTER — Ambulatory Visit (INDEPENDENT_AMBULATORY_CARE_PROVIDER_SITE_OTHER): Payer: Medicaid Other | Admitting: Family

## 2011-09-26 DIAGNOSIS — Z3480 Encounter for supervision of other normal pregnancy, unspecified trimester: Secondary | ICD-10-CM

## 2011-09-26 NOTE — Progress Notes (Signed)
p-99 feet and hand joint pain @ night

## 2011-09-26 NOTE — Progress Notes (Signed)
[place] on 4/17, twin A 86%, twin B 75%, normal AFIx2; pt desires VBAC and intermittent monitoring, will meet with MD next week to discuss plan.  Preterm labor precautions discussed.

## 2011-10-01 ENCOUNTER — Ambulatory Visit (INDEPENDENT_AMBULATORY_CARE_PROVIDER_SITE_OTHER): Payer: Medicaid Other | Admitting: Obstetrics & Gynecology

## 2011-10-01 VITALS — BP 108/70 | Temp 97.5°F | Wt 153.0 lb

## 2011-10-01 DIAGNOSIS — Z3480 Encounter for supervision of other normal pregnancy, unspecified trimester: Secondary | ICD-10-CM

## 2011-10-01 DIAGNOSIS — O30049 Twin pregnancy, dichorionic/diamniotic, unspecified trimester: Secondary | ICD-10-CM

## 2011-10-01 NOTE — Progress Notes (Signed)
Routine visit. Irregular contractions last weekend, none today. Reports good FM times 2. Denies VB or ROM. Labor precautions given. We had a long discussion about her desires for the delivery of her VTX/VTX twins/VBAC. I suspect she will have an uncomplicated VBAC.

## 2011-10-01 NOTE — Progress Notes (Signed)
P-70 

## 2011-10-02 LAB — GC/CHLAMYDIA PROBE AMP, URINE: Chlamydia, Swab/Urine, PCR: NEGATIVE

## 2011-10-06 ENCOUNTER — Inpatient Hospital Stay (HOSPITAL_COMMUNITY)
Admission: AD | Admit: 2011-10-06 | Discharge: 2011-10-06 | Disposition: A | Discharge status: Home / Self Care | Payer: Medicaid Other | Source: Ambulatory Visit | Attending: Obstetrics & Gynecology | Admitting: Obstetrics & Gynecology

## 2011-10-06 ENCOUNTER — Encounter (HOSPITAL_COMMUNITY): Payer: Self-pay | Admitting: *Deleted

## 2011-10-06 DIAGNOSIS — O0990 Supervision of high risk pregnancy, unspecified, unspecified trimester: Secondary | ICD-10-CM

## 2011-10-06 DIAGNOSIS — O30049 Twin pregnancy, dichorionic/diamniotic, unspecified trimester: Secondary | ICD-10-CM

## 2011-10-06 DIAGNOSIS — O99820 Streptococcus B carrier state complicating pregnancy: Secondary | ICD-10-CM

## 2011-10-06 DIAGNOSIS — O34219 Maternal care for unspecified type scar from previous cesarean delivery: Secondary | ICD-10-CM

## 2011-10-06 DIAGNOSIS — O4700 False labor before 37 completed weeks of gestation, unspecified trimester: Secondary | ICD-10-CM | POA: Insufficient documentation

## 2011-10-06 NOTE — MAU Provider Note (Signed)
History     CSN: [id number]  Arrival date and time: 10/06/11 1223   First Provider Initiated Contact with Patient 10/06/11 1321      Chief Complaint  Patient presents with  . Labor Eval   HPI 31 y.o. [id number] at [redacted]w[redacted]d, twin IUP, with contractions since 0830 this morning, reports several episodes of contractions over the last week, some bloody show over the weekend. + fetal movement. Pt desires VBAC.   Past Medical History  Diagnosis Date  . PONV (postoperative nausea and vomiting)     Pt was put under general for CSection due to mal function with epidural  . PCOS (polycystic ovarian syndrome)   . Lactose intolerance   . Anxiety     Past Surgical History  Procedure Date  . Tonsillectomy 2005  . Breast enhancement surgery 2007  . Breast biopsy 2009  . Cholecystectomy 2009  . Cesarean section 2010  . Breast fibroid     Family History  Problem Relation Age of Onset  . Heart disease Mother   . Fibroids Mother   . Endometriosis Mother   . Cancer Maternal Grandmother     ovarian  . Hypertension Maternal Grandfather   . Diabetes Maternal Grandfather   . Cancer Paternal Grandfather     brain tumor  . Cancer Paternal Grandmother     uterine  . Anesthesia problems Neg Hx     History  Substance Use Topics  . Smoking status: Never Smoker   . Smokeless tobacco: Never Used  . Alcohol Use: No    Allergies:  Allergies  Allergen Reactions  . Erythromycin Swelling  . Flagyl (Metronidazole Hcl) Swelling    Prescriptions prior to admission  Medication Sig Dispense Refill  . Ferrous Sulfate (IRON SUPPLEMENT PO) Take 1 tablet by mouth every morning.      Boris Lown Oil CAPS Take by mouth daily.      . Nutritional Supplements (JUICE PLUS FIBRE PO) Take by mouth 3 (three) times daily.       . Nutritional Supplements (VITAMIN D BOOSTER PO) Take by mouth daily.        Review of Systems  Constitutional: Negative.   Respiratory: Negative.   Cardiovascular: Negative.     Gastrointestinal: Negative for nausea, vomiting, abdominal pain, diarrhea and constipation.  Genitourinary: Negative for dysuria, urgency, frequency, hematuria and flank pain.       Positive for bloody show and contractions  Musculoskeletal: Negative.   Neurological: Negative.   Psychiatric/Behavioral: Negative.    Physical Exam   Blood pressure 108/68, pulse 79, temperature 97.8 F (36.6 C), temperature source Oral, resp. rate 18, height 5\' 5"  (1.651 m), weight 153 lb (69.4 kg), unknown if currently breastfeeding.  Physical Exam  Nursing note and vitals reviewed. Constitutional: She is oriented to person, place, and time. She appears well-developed and well-nourished. No distress.  Cardiovascular: Normal rate.   Respiratory: Effort normal.  GI: Soft. There is no tenderness.  Genitourinary:       SVE: 4/90/-1/no bleeding/presenting twin vertex on exam  Musculoskeletal: Normal range of motion.  Neurological: She is alert and oriented to person, place, and time.  Skin: Skin is warm and dry.  Psychiatric: She has a normal mood and affect.   EFM: reactive x 2, TOCO: q 7 min MAU Course  Procedures  Pt to walk, reassess cervix in 1.5 hours - after walking x 1.5 hours, no cervical change  Assessment and Plan  30 y.o. [name] at 35.[redacted] weeks EGA Threatened  labor - no cervical change at this time, rev'd precautions, d/c home F/U in office tomorrow or sooner with signs of active labor  Susanna Benge 10/06/2011, 1:21 PM

## 2011-10-06 NOTE — MAU Note (Signed)
Pt in for labor eval.  Reports ucs since 0800 and lower back pain.  Reports some bloody show and mucus. Plans to VBAC

## 2011-10-07 ENCOUNTER — Inpatient Hospital Stay (HOSPITAL_COMMUNITY): Payer: Medicaid Other | Admitting: Anesthesiology

## 2011-10-07 ENCOUNTER — Encounter (HOSPITAL_COMMUNITY)
Admission: AD | Disposition: A | Discharge status: Home / Self Care | Payer: Self-pay | Source: Ambulatory Visit | Attending: Obstetrics & Gynecology

## 2011-10-07 ENCOUNTER — Encounter (HOSPITAL_COMMUNITY): Payer: Self-pay | Admitting: Anesthesiology

## 2011-10-07 ENCOUNTER — Encounter (HOSPITAL_COMMUNITY): Payer: Self-pay | Admitting: *Deleted

## 2011-10-07 ENCOUNTER — Inpatient Hospital Stay (HOSPITAL_COMMUNITY)
Admission: AD | Admit: 2011-10-07 | Discharge: 2011-10-10 | DRG: 765 | Disposition: A | Discharge status: Home / Self Care | Payer: Medicaid Other | Source: Ambulatory Visit | Attending: Obstetrics & Gynecology | Admitting: Obstetrics & Gynecology

## 2011-10-07 DIAGNOSIS — O99820 Streptococcus B carrier state complicating pregnancy: Secondary | ICD-10-CM

## 2011-10-07 DIAGNOSIS — O30049 Twin pregnancy, dichorionic/diamniotic, unspecified trimester: Secondary | ICD-10-CM

## 2011-10-07 DIAGNOSIS — O30009 Twin pregnancy, unspecified number of placenta and unspecified number of amniotic sacs, unspecified trimester: Secondary | ICD-10-CM

## 2011-10-07 DIAGNOSIS — O9903 Anemia complicating the puerperium: Secondary | ICD-10-CM | POA: Diagnosis not present

## 2011-10-07 DIAGNOSIS — Z98891 History of uterine scar from previous surgery: Secondary | ICD-10-CM

## 2011-10-07 DIAGNOSIS — D62 Acute posthemorrhagic anemia: Secondary | ICD-10-CM

## 2011-10-07 DIAGNOSIS — O0990 Supervision of high risk pregnancy, unspecified, unspecified trimester: Secondary | ICD-10-CM

## 2011-10-07 DIAGNOSIS — O34219 Maternal care for unspecified type scar from previous cesarean delivery: Secondary | ICD-10-CM | POA: Diagnosis present

## 2011-10-07 LAB — CBC
MCH: 32.3 pg (ref 26.0–34.0)
MCHC: 33.1 g/dL (ref 30.0–36.0)
Platelets: 153 10*3/uL (ref 150–400)
RDW: 14.1 % (ref 11.5–15.5)

## 2011-10-07 LAB — RPR: RPR Ser Ql: NONREACTIVE

## 2011-10-07 SURGERY — Surgical Case
Anesthesia: Regional | Site: Abdomen | Wound class: Clean Contaminated

## 2011-10-07 MED ORDER — WITCH HAZEL-GLYCERIN EX PADS: 1.0000 "application " | MEDICATED_PAD | CUTANEOUS | Status: DC | PRN

## 2011-10-07 MED ORDER — MEPERIDINE HCL 25 MG/ML IJ SOLN
INTRAMUSCULAR | Status: AC
  Filled 2011-10-07: qty 1

## 2011-10-07 MED ORDER — PHENYLEPHRINE 40 MCG/ML (10ML) SYRINGE FOR IV PUSH (FOR BLOOD PRESSURE SUPPORT)
80.0000 ug | PREFILLED_SYRINGE | INTRAVENOUS | Status: DC | PRN
  Administered 2011-10-07: 80 ug via INTRAVENOUS
  Filled 2011-10-07: qty 5

## 2011-10-07 MED ORDER — PHENYLEPHRINE 40 MCG/ML (10ML) SYRINGE FOR IV PUSH (FOR BLOOD PRESSURE SUPPORT)
PREFILLED_SYRINGE | INTRAVENOUS | Status: AC
  Filled 2011-10-07: qty 5

## 2011-10-07 MED ORDER — SENNOSIDES-DOCUSATE SODIUM 8.6-50 MG PO TABS
2.0000 | ORAL_TABLET | Freq: Every day | ORAL | Status: DC
  Administered 2011-10-07 – 2011-10-09 (×3): 2 via ORAL

## 2011-10-07 MED ORDER — TETANUS-DIPHTH-ACELL PERTUSSIS 5-2.5-18.5 LF-MCG/0.5 IM SUSP: 0.5000 mL | Freq: Once | INTRAMUSCULAR | Status: DC

## 2011-10-07 MED ORDER — OXYTOCIN 20 UNITS IN LACTATED RINGERS INFUSION - SIMPLE
125.0000 mL/h | Freq: Once | INTRAVENOUS | Status: DC
  Filled 2011-10-07: qty 1000

## 2011-10-07 MED ORDER — SCOPOLAMINE 1 MG/3DAYS TD PT72
1.0000 | MEDICATED_PATCH | Freq: Once | TRANSDERMAL | Status: DC
  Administered 2011-10-07: 1.5 mg via TRANSDERMAL
  Filled 2011-10-07: qty 1

## 2011-10-07 MED ORDER — MORPHINE SULFATE (PF) 0.5 MG/ML IJ SOLN
INTRAMUSCULAR | Status: DC | PRN
  Administered 2011-10-07: 4 mg via EPIDURAL

## 2011-10-07 MED ORDER — SIMETHICONE 80 MG PO CHEW
80.0000 mg | CHEWABLE_TABLET | Freq: Three times a day (TID) | ORAL | Status: DC
  Administered 2011-10-08 – 2011-10-10 (×8): 80 mg via ORAL

## 2011-10-07 MED ORDER — DIPHENHYDRAMINE HCL 50 MG/ML IJ SOLN: 12.5000 mg | INTRAMUSCULAR | Status: DC | PRN

## 2011-10-07 MED ORDER — SIMETHICONE 80 MG PO CHEW: 80.0000 mg | CHEWABLE_TABLET | ORAL | Status: DC | PRN

## 2011-10-07 MED ORDER — LACTATED RINGERS IV SOLN
INTRAVENOUS | Status: DC
  Administered 2011-10-07: 23:00:00 via INTRAVENOUS

## 2011-10-07 MED ORDER — LACTATED RINGERS IV SOLN: 500.0000 mL | Freq: Once | INTRAVENOUS | Status: DC

## 2011-10-07 MED ORDER — PHENYLEPHRINE 40 MCG/ML (10ML) SYRINGE FOR IV PUSH (FOR BLOOD PRESSURE SUPPORT)
PREFILLED_SYRINGE | INTRAVENOUS | Status: AC
  Filled 2011-10-07: qty 15

## 2011-10-07 MED ORDER — LIDOCAINE HCL (PF) 1 % IJ SOLN
INTRAMUSCULAR | Status: DC | PRN
  Administered 2011-10-07 (×2): 5 mL

## 2011-10-07 MED ORDER — SODIUM BICARBONATE 8.4 % IV SOLN
INTRAVENOUS | Status: DC | PRN
  Administered 2011-10-07: 15 mL via EPIDURAL

## 2011-10-07 MED ORDER — DIPHENHYDRAMINE HCL 25 MG PO CAPS: 25.0000 mg | ORAL_CAPSULE | ORAL | Status: DC | PRN

## 2011-10-07 MED ORDER — ONDANSETRON HCL 4 MG/2ML IJ SOLN: 4.0000 mg | Freq: Four times a day (QID) | INTRAMUSCULAR | Status: DC | PRN

## 2011-10-07 MED ORDER — CEFAZOLIN SODIUM 1-5 GM-% IV SOLN
INTRAVENOUS | Status: DC | PRN
  Administered 2011-10-07: 1 g via INTRAVENOUS

## 2011-10-07 MED ORDER — DIPHENHYDRAMINE HCL 25 MG PO CAPS: 25.0000 mg | ORAL_CAPSULE | Freq: Four times a day (QID) | ORAL | Status: DC | PRN

## 2011-10-07 MED ORDER — ONDANSETRON HCL 4 MG/2ML IJ SOLN: 4.0000 mg | Freq: Three times a day (TID) | INTRAMUSCULAR | Status: DC | PRN

## 2011-10-07 MED ORDER — SODIUM CHLORIDE 0.9 % IV SOLN
2.0000 g | Freq: Once | INTRAVENOUS | Status: AC
  Administered 2011-10-07: 2 g via INTRAVENOUS
  Filled 2011-10-07: qty 2000

## 2011-10-07 MED ORDER — ACETAMINOPHEN 325 MG PO TABS: 650.0000 mg | ORAL_TABLET | ORAL | Status: DC | PRN

## 2011-10-07 MED ORDER — NALBUPHINE HCL 10 MG/ML IJ SOLN
5.0000 mg | INTRAMUSCULAR | Status: DC | PRN
  Filled 2011-10-07: qty 1

## 2011-10-07 MED ORDER — OXYTOCIN 20 UNITS IN LACTATED RINGERS INFUSION - SIMPLE: 125.0000 mL/h | INTRAVENOUS | Status: AC

## 2011-10-07 MED ORDER — MORPHINE SULFATE 0.5 MG/ML IJ SOLN
INTRAMUSCULAR | Status: AC
  Filled 2011-10-07: qty 10

## 2011-10-07 MED ORDER — NALOXONE HCL 0.4 MG/ML IJ SOLN: 0.4000 mg | INTRAMUSCULAR | Status: DC | PRN

## 2011-10-07 MED ORDER — OXYTOCIN 10 UNIT/ML IJ SOLN
INTRAMUSCULAR | Status: AC
  Filled 2011-10-07: qty 2

## 2011-10-07 MED ORDER — SODIUM CHLORIDE 0.9 % IJ SOLN: 3.0000 mL | INTRAMUSCULAR | Status: DC | PRN

## 2011-10-07 MED ORDER — OXYCODONE-ACETAMINOPHEN 5-325 MG PO TABS: 1.0000 | ORAL_TABLET | ORAL | Status: DC | PRN

## 2011-10-07 MED ORDER — PRENATAL MULTIVITAMIN CH: 1.0000 | ORAL_TABLET | Freq: Every day | ORAL | Status: DC

## 2011-10-07 MED ORDER — SODIUM BICARBONATE 8.4 % IV SOLN
INTRAVENOUS | Status: DC | PRN
  Administered 2011-10-07: 20 mL via EPIDURAL

## 2011-10-07 MED ORDER — CITRIC ACID-SODIUM CITRATE 334-500 MG/5ML PO SOLN: 30.0000 mL | ORAL | Status: DC | PRN

## 2011-10-07 MED ORDER — ONDANSETRON HCL 4 MG/2ML IJ SOLN: 4.0000 mg | INTRAMUSCULAR | Status: DC | PRN

## 2011-10-07 MED ORDER — SODIUM CHLORIDE 0.9 % IV SOLN
1.0000 ug/kg/h | INTRAVENOUS | Status: DC | PRN
  Filled 2011-10-07: qty 2.5

## 2011-10-07 MED ORDER — IBUPROFEN 600 MG PO TABS: 600.0000 mg | ORAL_TABLET | Freq: Four times a day (QID) | ORAL | Status: DC | PRN

## 2011-10-07 MED ORDER — KETOROLAC TROMETHAMINE 60 MG/2ML IM SOLN
60.0000 mg | Freq: Once | INTRAMUSCULAR | Status: AC | PRN
  Administered 2011-10-07: 60 mg via INTRAMUSCULAR

## 2011-10-07 MED ORDER — LACTATED RINGERS IV SOLN
INTRAVENOUS | Status: DC
  Administered 2011-10-07: 08:00:00 via INTRAVENOUS

## 2011-10-07 MED ORDER — KETOROLAC TROMETHAMINE 30 MG/ML IJ SOLN: 30.0000 mg | Freq: Four times a day (QID) | INTRAMUSCULAR | Status: AC | PRN

## 2011-10-07 MED ORDER — MORPHINE SULFATE (PF) 0.5 MG/ML IJ SOLN
INTRAMUSCULAR | Status: DC | PRN
  Administered 2011-10-07: 1 mg via INTRAVENOUS

## 2011-10-07 MED ORDER — EPHEDRINE 5 MG/ML INJ: 10.0000 mg | INTRAVENOUS | Status: DC | PRN

## 2011-10-07 MED ORDER — EPHEDRINE 5 MG/ML INJ
10.0000 mg | INTRAVENOUS | Status: DC | PRN
  Administered 2011-10-07: 10 mg via INTRAVENOUS
  Filled 2011-10-07: qty 4

## 2011-10-07 MED ORDER — DIBUCAINE 1 % RE OINT: 1.0000 "application " | TOPICAL_OINTMENT | RECTAL | Status: DC | PRN

## 2011-10-07 MED ORDER — FENTANYL CITRATE 0.05 MG/ML IJ SOLN
INTRAMUSCULAR | Status: AC
  Administered 2011-10-07: 25 ug
  Filled 2011-10-07: qty 2

## 2011-10-07 MED ORDER — OXYCODONE-ACETAMINOPHEN 5-325 MG PO TABS
1.0000 | ORAL_TABLET | ORAL | Status: DC | PRN
  Administered 2011-10-08 – 2011-10-10 (×9): 1 via ORAL
  Filled 2011-10-07 (×9): qty 1

## 2011-10-07 MED ORDER — LANOLIN HYDROUS EX OINT: 1.0000 "application " | TOPICAL_OINTMENT | CUTANEOUS | Status: DC | PRN

## 2011-10-07 MED ORDER — METOCLOPRAMIDE HCL 5 MG/ML IJ SOLN: 10.0000 mg | Freq: Three times a day (TID) | INTRAMUSCULAR | Status: DC | PRN

## 2011-10-07 MED ORDER — FLEET ENEMA 7-19 GM/118ML RE ENEM: 1.0000 | ENEMA | RECTAL | Status: DC | PRN

## 2011-10-07 MED ORDER — PHENYLEPHRINE HCL 10 MG/ML IJ SOLN
INTRAMUSCULAR | Status: DC | PRN
  Administered 2011-10-07: 80 ug via INTRAVENOUS
  Administered 2011-10-07: 40 ug via INTRAVENOUS
  Administered 2011-10-07: 120 ug via INTRAVENOUS
  Administered 2011-10-07: 80 ug via INTRAVENOUS
  Administered 2011-10-07 (×3): 40 ug via INTRAVENOUS
  Administered 2011-10-07 (×3): 80 ug via INTRAVENOUS
  Administered 2011-10-07 (×2): 40 ug via INTRAVENOUS
  Administered 2011-10-07 (×5): 80 ug via INTRAVENOUS

## 2011-10-07 MED ORDER — PHENYLEPHRINE 40 MCG/ML (10ML) SYRINGE FOR IV PUSH (FOR BLOOD PRESSURE SUPPORT): 80.0000 ug | PREFILLED_SYRINGE | INTRAVENOUS | Status: DC | PRN

## 2011-10-07 MED ORDER — ONDANSETRON HCL 4 MG PO TABS: 4.0000 mg | ORAL_TABLET | ORAL | Status: DC | PRN

## 2011-10-07 MED ORDER — ONDANSETRON HCL 4 MG/2ML IJ SOLN
INTRAMUSCULAR | Status: AC
  Filled 2011-10-07: qty 2

## 2011-10-07 MED ORDER — MEPERIDINE HCL 25 MG/ML IJ SOLN
INTRAMUSCULAR | Status: DC | PRN
  Administered 2011-10-07: 12.5 mg via INTRAVENOUS

## 2011-10-07 MED ORDER — IBUPROFEN 600 MG PO TABS
600.0000 mg | ORAL_TABLET | Freq: Four times a day (QID) | ORAL | Status: DC
  Administered 2011-10-07 – 2011-10-10 (×10): 600 mg via ORAL
  Filled 2011-10-07 (×13): qty 1

## 2011-10-07 MED ORDER — LIDOCAINE HCL (PF) 1 % IJ SOLN: 30.0000 mL | INTRAMUSCULAR | Status: DC | PRN

## 2011-10-07 MED ORDER — DIPHENHYDRAMINE HCL 50 MG/ML IJ SOLN: 25.0000 mg | INTRAMUSCULAR | Status: DC | PRN

## 2011-10-07 MED ORDER — ONDANSETRON HCL 4 MG/2ML IJ SOLN
INTRAMUSCULAR | Status: DC | PRN
  Administered 2011-10-07: 4 mg via INTRAVENOUS

## 2011-10-07 MED ORDER — FENTANYL 2.5 MCG/ML BUPIVACAINE 1/10 % EPIDURAL INFUSION (WH - ANES): 14.0000 mL/h | INTRAMUSCULAR | Status: DC

## 2011-10-07 MED ORDER — OXYTOCIN 10 UNIT/ML IJ SOLN
INTRAMUSCULAR | Status: DC | PRN
  Administered 2011-10-07: 20 [IU]

## 2011-10-07 MED ORDER — MENTHOL 3 MG MT LOZG: 1.0000 | LOZENGE | OROMUCOSAL | Status: DC | PRN

## 2011-10-07 MED ORDER — KETOROLAC TROMETHAMINE 60 MG/2ML IM SOLN
INTRAMUSCULAR | Status: AC
  Filled 2011-10-07: qty 2

## 2011-10-07 MED ORDER — LACTATED RINGERS IV SOLN
500.0000 mL | Freq: Once | INTRAVENOUS | Status: AC
  Administered 2011-10-07: 13:00:00 via INTRAVENOUS
  Administered 2011-10-07: 500 mL via INTRAVENOUS
  Administered 2011-10-07: 13:00:00 via INTRAVENOUS

## 2011-10-07 MED ORDER — OXYTOCIN 20 UNITS IN LACTATED RINGERS INFUSION - SIMPLE
INTRAVENOUS | Status: AC
  Administered 2011-10-07: 20 [IU]
  Filled 2011-10-07: qty 1000

## 2011-10-07 MED ORDER — ZOLPIDEM TARTRATE 5 MG PO TABS: 5.0000 mg | ORAL_TABLET | Freq: Every evening | ORAL | Status: DC | PRN

## 2011-10-07 MED ORDER — PHENYLEPHRINE 40 MCG/ML (10ML) SYRINGE FOR IV PUSH (FOR BLOOD PRESSURE SUPPORT)
PREFILLED_SYRINGE | INTRAVENOUS | Status: AC
  Filled 2011-10-07: qty 10

## 2011-10-07 MED ORDER — MEPERIDINE HCL 25 MG/ML IJ SOLN: 6.2500 mg | INTRAMUSCULAR | Status: DC | PRN

## 2011-10-07 MED ORDER — OXYTOCIN BOLUS FROM INFUSION
500.0000 mL | Freq: Once | INTRAVENOUS | Status: DC
  Filled 2011-10-07: qty 500

## 2011-10-07 MED ORDER — LACTATED RINGERS IV SOLN: 500.0000 mL | INTRAVENOUS | Status: DC | PRN

## 2011-10-07 MED ORDER — SODIUM CHLORIDE 0.9 % IV SOLN
1.0000 g | Freq: Four times a day (QID) | INTRAVENOUS | Status: DC
  Administered 2011-10-07: 1 g via INTRAVENOUS
  Filled 2011-10-07 (×4): qty 1000

## 2011-10-07 MED ORDER — FENTANYL 2.5 MCG/ML BUPIVACAINE 1/10 % EPIDURAL INFUSION (WH - ANES)
14.0000 mL/h | INTRAMUSCULAR | Status: DC
  Administered 2011-10-07 (×3): 14 mL/h via EPIDURAL
  Filled 2011-10-07 (×3): qty 60

## 2011-10-07 SURGICAL SUPPLY — 26 items
BARRIER ADHS 3X4 INTERCEED (GAUZE/BANDAGES/DRESSINGS) IMPLANT
CHLORAPREP W/TINT 26ML (MISCELLANEOUS) ×2 IMPLANT
CLOTH BEACON ORANGE TIMEOUT ST (SAFETY) ×2 IMPLANT
DRSG COVADERM 4X10 (GAUZE/BANDAGES/DRESSINGS) ×2 IMPLANT
ELECT REM PT RETURN 9FT ADLT (ELECTROSURGICAL) ×2
ELECTRODE REM PT RTRN 9FT ADLT (ELECTROSURGICAL) ×1 IMPLANT
EXTRACTOR VACUUM KIWI (MISCELLANEOUS) IMPLANT
GLOVE BIO SURGEON STRL SZ 6.5 (GLOVE) ×2 IMPLANT
GLOVE BIOGEL PI IND STRL 7.0 (GLOVE) ×2 IMPLANT
GLOVE BIOGEL PI INDICATOR 7.0 (GLOVE) ×2
GOWN PREVENTION PLUS LG XLONG (DISPOSABLE) ×6 IMPLANT
KIT ABG SYR 3ML LUER SLIP (SYRINGE) ×2 IMPLANT
NEEDLE HYPO 25X5/8 SAFETYGLIDE (NEEDLE) ×2 IMPLANT
NS IRRIG 1000ML POUR BTL (IV SOLUTION) ×2 IMPLANT
PACK C SECTION WH (CUSTOM PROCEDURE TRAY) ×2 IMPLANT
PAD ABD 7.5X8 STRL (GAUZE/BANDAGES/DRESSINGS) ×2 IMPLANT
SLEEVE SCD COMPRESS KNEE LRG (MISCELLANEOUS) IMPLANT
SLEEVE SCD COMPRESS KNEE MED (MISCELLANEOUS) IMPLANT
STRIP CLOSURE SKIN 1/2X4 (GAUZE/BANDAGES/DRESSINGS) ×2 IMPLANT
SUT VIC AB 0 CT1 36 (SUTURE) ×12 IMPLANT
SUT VIC AB 2-0 CT1 27 (SUTURE) ×1
SUT VIC AB 2-0 CT1 TAPERPNT 27 (SUTURE) ×1 IMPLANT
SUT VIC AB 4-0 PS2 27 (SUTURE) ×2 IMPLANT
TOWEL OR 17X24 6PK STRL BLUE (TOWEL DISPOSABLE) ×4 IMPLANT
TRAY FOLEY CATH 14FR (SET/KITS/TRAYS/PACK) ×2 IMPLANT
WATER STERILE IRR 1000ML POUR (IV SOLUTION) IMPLANT

## 2011-10-07 NOTE — Anesthesia Postprocedure Evaluation (Signed)
  Anesthesia Post-op Note  Patient: Jillian Mcguire  Procedure(s) Performed: Procedure(s) (LRB): CESAREAN SECTION (N/A)  Patient is awake, responsive, moving her legs, and has signs of resolution of her numbness. Pain and nausea are reasonably well controlled. Vital signs are stable and clinically acceptable. Oxygen saturation is clinically acceptable. There are no apparent anesthetic complications at this time. Patient is ready for discharge.

## 2011-10-07 NOTE — Op Note (Signed)
Procedure: Repeat low transverse cesarean section. Repair of second-degree perineal laceration Preoperative diagnosis: Cord prolapse second twin in a 36 week twin gestation Postoperative diagnosis: Same, second-degree perineal laceration Surgeon: Dr. Scheryl Darter Assistant: Dr. Candelaria Celeste Anesthesia: Epidural Findings: Live born female infant, Apgars 5 and 7, cord pH 7.09 Complications: None Drains: Foley catheter Estimated blood loss: 500 mL Specimen: Placenta and cord blood gas Counts: Correct  Patient is in labor with a 36 week twin gestation. She delivered baby A spontaneously without complication. After spontaneous rupture of membranes the umbilical cord of baby B prolapsed. She gave verbal consent for an emergency repeat cesarean section due to cord prolapse. Patient had an epidural catheter in place. She was brought to the operating room and adequate epidural anesthesia was induced. She was placed in dorsal supine position left lateral tilt. The abdomen was sterilely prepped and draped. A #10 blade was used to make a transverse Pfannenstiel incision at the site of her previous cesarean section scar for fascia was incised and the incision was extended transversely. Fascia was separated from underlying tissue attachments and scar. Those of the rectus muscles were separated. The peritoneum was entered. Some filmy bladder adhesions were lysed and a low transverse incision was made with #10 blade and the uterine cavity was entered. The incision was extended transversely. Fetal head was elevated. Initially the brow was presenting at the patient was converted to a vertex. The head was delivered atraumatically. Mouth and nose were cleared with bulb suction at the was delivered. Cord clamped and cut and infant was handed to nursery personnel. Infant was a live born female Apgars 5 and 8 with a cord pH of 7.09. The placenta was removed and the uterine cavity was explored. The umbilical cord for baby A was  removed through the vagina. The placenta was sent to pathology. Patient received IV 1 g. The uterine incision was closed with a running locking suture with 0 Vicryl. An imbricating layer followed with 0 Vicryl good hemostasis was seen. Both adnexa were inspected. The anterior peritoneum was closed with a running suture with 2-0 Vicryl. The fascia was closed with a running suture with 0 Vicryl. The hemostasis was seen the incision and the incision was irrigated. The skin was closed with a running subcuticular suture of 4-0 Vicryl. Sterile dressing was applied. Patient tolerated the procedure well without complications. A Foley catheter was inserted during the procedure. She was brought in stable condition to the PACU.  Dr. Scheryl Darter 10/07/2011 2:02 PM

## 2011-10-07 NOTE — Progress Notes (Signed)
Patient ID: Jillian Mcguire, female   DOB: Jul 27, 1980, 31 y.o.   MRN: [id number]  Doing well. Now has Epidural.  Just had SROM  FHR reassuring x 2  UCs q 4 minutes  Cervix 7-8 per RN  Will continue to observe

## 2011-10-07 NOTE — Anesthesia Preprocedure Evaluation (Signed)
Anesthesia Evaluation  Patient identified by MRN, date of birth, ID band Patient awake    Reviewed: Allergy & Precautions, H&P , Patient's Chart, lab work & pertinent test results  History of Anesthesia Complications (+) PONV  Airway Mallampati: II TM Distance: >3 FB Neck ROM: full    Dental No notable dental hx.    Pulmonary neg pulmonary ROS,  breath sounds clear to auscultation  Pulmonary exam normal       Cardiovascular negative cardio ROS  Rhythm:regular Rate:Normal     Neuro/Psych negative neurological ROS  negative psych ROS   GI/Hepatic negative GI ROS, Neg liver ROS,   Endo/Other  negative endocrine ROS  Renal/GU negative Renal ROS     Musculoskeletal   Abdominal   Peds  Hematology negative hematology ROS (+)   Anesthesia Other Findings   Reproductive/Obstetrics (+) Pregnancy                           Anesthesia Physical Anesthesia Plan  ASA: II  Anesthesia Plan: Epidural   Post-op Pain Management:    Induction:   Airway Management Planned:   Additional Equipment:   Intra-op Plan:   Post-operative Plan:   Informed Consent: I have reviewed the patients History and Physical, chart, labs and discussed the procedure including the risks, benefits and alternatives for the proposed anesthesia with the patient or authorized representative who has indicated his/her understanding and acceptance.     Plan Discussed with:   Anesthesia Plan Comments:         Anesthesia Quick Evaluation  

## 2011-10-07 NOTE — Progress Notes (Signed)
Jillian Mcguire is a 31 y.o. G4P1021 at [redacted]w[redacted]d  Subjective: Pushing well x approx 20 mins; feeling urge  Objective: BP 110/61  Pulse 74  Temp(Src) 98.4 F (36.9 C) (Axillary)  Resp 20  SpO2 96%  Breastfeeding? Unknown      FHT:  FHR: 130/140 bpm, variability: moderate,  accelerations:  Present,  decelerations:  Absentreactive x 2 UC:   regular, every 3-5 minutes SVE:   Dilation: 10 Effacement (%): 100 Station: +1;+2 Exam by:: Raliegh Ip RN  Labs: Lab Results  Component Value Date   WBC 9.3 10/07/2011   HGB 11.9* 10/07/2011   HCT 36.0 10/07/2011   MCV 97.8 10/07/2011   PLT 153 10/07/2011    Assessment / Plan: Rev'd with Dr Debroah Loop: plan del in room Will continue pushing Anticipate VBAC  Cam Hai 10/07/2011, 12:04 PM

## 2011-10-07 NOTE — Progress Notes (Signed)
  Patient ID: Jillian Mcguire, female   DOB: [birth year], 31 y.o.   MRN: [id number]  Comfortable  FHR reactive in both twins  UCs still only q 4-6 minutes  Cervix deferred  May need to consider Pitocin augmentation.

## 2011-10-07 NOTE — Progress Notes (Signed)
Patient ID: Jillian Mcguire, female   DOB: 1980/11/16, 31 y.o.   MRN: [id number]  Doing well, comfortable  FHR reactive for both twins  UCs every 4-7 minutes  Abdomen soft and nontneder  Cervix 8/90/-1/vtx  Will continue to observe

## 2011-10-07 NOTE — Progress Notes (Signed)
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

## 2011-10-07 NOTE — Progress Notes (Signed)
Send pt to birthing suites.

## 2011-10-07 NOTE — Progress Notes (Signed)
Patient ID: Jillian Mcguire, female   DOB: 03/13/1981, 31 y.o.   MRN: [id number]  Doing well, comfortable  FHR reactive in both twins  UCs spaced out, q 6-7 minutes  AROM forebag for brown tinged fluid  Cervix 9/c/0/vtx  Discussed Pitocin augmentation. Pt declines. States as long as babies look good, she would like to wait and let labor happen on its own.   Will watch and see if amniotomy will stimulate more labor.

## 2011-10-07 NOTE — Progress Notes (Signed)
Pt may go to room 172. 

## 2011-10-07 NOTE — Anesthesia Procedure Notes (Signed)
Epidural Patient location during procedure: OB Start time: 10/07/2011 2:03 AM  Staffing Anesthesiologist: Brayton Caves R Performed by: anesthesiologist   Preanesthetic Checklist Completed: patient identified, site marked, surgical consent, pre-op evaluation, timeout performed, IV checked, risks and benefits discussed and monitors and equipment checked  Epidural Patient position: sitting Prep: site prepped and draped and DuraPrep Patient monitoring: continuous pulse ox and blood pressure Approach: midline Injection technique: LOR air and LOR saline  Needle:  Needle type: Tuohy  Needle gauge: 17 G Needle length: 9 cm Needle insertion depth: 5 cm cm Catheter type: closed end flexible Catheter size: 19 Gauge Catheter at skin depth: 10 cm Test dose: negative  Assessment Events: blood not aspirated, injection not painful, no injection resistance, negative IV test and no paresthesia  Additional Notes Patient identified.  Risk benefits discussed including failed block, incomplete pain control, headache, nerve damage, paralysis, blood pressure changes, nausea, vomiting, reactions to medication both toxic or allergic, and postpartum back pain.  Patient expressed understanding and wished to proceed.  All questions were answered.  Sterile technique used throughout procedure and epidural site dressed with sterile barrier dressing. No paresthesia or other complications noted.The patient did not experience any signs of intravascular injection such as tinnitus or metallic taste in mouth nor signs of intrathecal spread such as rapid motor block. Please see nursing notes for vital signs.

## 2011-10-07 NOTE — Progress Notes (Signed)
Jillian Mcguire is a 31 y.o. G4P1021 at [redacted]w[redacted]d  Subjective: Mostly comfortable with epidural, although having some pressure at times  Objective: BP 107/66  Pulse 71  Temp(Src) 98.4 F (36.9 C) (Axillary)  Resp 20  SpO2 96%  Breastfeeding? Unknown      FHT:  FHR: 145 bpm, variability: moderate,  accelerations:  Present,  decelerations:  Absent reactive x 2 UC:   regular, every 3-4 minutes SVE:   Dilation: 10 Effacement (%): 100 Station: +1 Exam by:: Philipp Deputy CNM  Labs: Lab Results  Component Value Date   WBC 9.3 10/07/2011   HGB 11.9* 10/07/2011   HCT 36.0 10/07/2011   MCV 97.8 10/07/2011   PLT 153 10/07/2011    Assessment / Plan: Rev'd recommendation to labor down as she is not feeling a consistent urge to push Pt agreeable; will exam in 1-2 hrs or prn Anticipate SVD  Jillian Mcguire 10/07/2011, 10:23 AM

## 2011-10-07 NOTE — Transfer of Care (Signed)
Immediate Anesthesia Transfer of Care Note  Patient: Jillian Mcguire  Procedure(s) Performed: Procedure(s) (LRB): CESAREAN SECTION (N/A)  Patient Location: PACU  Anesthesia Type: Epidural  Level of Consciousness: awake, alert  and oriented  Airway & Oxygen Therapy: Patient Spontanous Breathing  Post-op Assessment: Report given to PACU RN  Post vital signs: Reviewed and stable  Complications: No apparent anesthesia complications

## 2011-10-07 NOTE — H&P (Signed)
Jillian Mcguire is a 31 y.o. female presenting for labor Maternal Medical History:  Reason for admission: Reason for admission: contractions.  Reason for Admission:   nauseaContractions: Onset was 3-5 hours ago.   Frequency: regular.   Perceived severity is moderate.    Fetal activity: Perceived fetal activity is normal.   Last perceived fetal movement was within the past hour.    Prenatal complications: No bleeding.     OB History    Grav Para Term Preterm Abortions TAB SAB Ect Mult Living   4 1 1  2  0 2   1     Past Medical History  Diagnosis Date  . PONV (postoperative nausea and vomiting)     Pt was put under general for CSection due to mal function with epidural  . PCOS (polycystic ovarian syndrome)   . Lactose intolerance   . Anxiety    Past Surgical History  Procedure Date  . Tonsillectomy 2005  . Breast enhancement surgery 2007  . Breast biopsy 2009  . Cholecystectomy 2009  . Cesarean section 2010  . Breast fibroid    Family History: family history includes Cancer in her maternal grandmother, paternal grandfather, and paternal grandmother; Diabetes in her maternal grandfather; Endometriosis in her mother; Fibroids in her mother; Heart disease in her mother; and Hypertension in her maternal grandfather.  There is no history of Anesthesia problems. Social History:  reports that she has never smoked. She has never used smokeless tobacco. She reports that she does not drink alcohol or use illicit drugs.  Review of Systems  Constitutional: Negative for fever.  Gastrointestinal: Positive for abdominal pain. Negative for nausea, vomiting, diarrhea and constipation.    Dilation: 8 Effacement (%): 100 Station: -1 Exam by:: Jillian Rolls, RN Blood pressure 122/75, pulse 73, temperature 98.1 F (36.7 C), temperature source Oral, resp. rate 20, SpO2 99.00%, unknown if currently breastfeeding. Maternal Exam:  Uterine Assessment: Contraction strength is moderate.   Contraction frequency is regular.   Abdomen: Surgical scars: low transverse.   Fundal height is c/w twins.   Estimated fetal weight is 6 lb each.   Fetal presentation: vertex  Introitus: Normal vulva. Normal vagina.  Vagina is negative for discharge.  Ferning test: not done.  Nitrazine test: not done. Amniotic fluid character: not assessed.  Pelvis: adequate for delivery.   Cervix: Cervix evaluated by digital exam.     Fetal Exam Fetal Monitor Review: Mode: ultrasound.   Baseline rate: 130s both.  Variability: moderate (6-25 bpm).   Pattern: accelerations present and no decelerations.    Fetal State Assessment: Category I - tracings are normal.     Physical Exam  Constitutional: She is oriented to person, place, and time. She appears well-developed and well-nourished.  HENT:  Head: Normocephalic.  Cardiovascular: Normal rate, regular rhythm and normal heart sounds.   Respiratory: Effort normal and breath sounds normal. She has no wheezes. She has no rales.  GI: Soft. She exhibits no distension. There is no tenderness. There is no rebound and no guarding.  Genitourinary: Vagina normal and uterus normal. No vaginal discharge found.       Bedside US by Dr Jillian Mcguire showed vertex / vertex  Musculoskeletal: Normal range of motion.  Neurological: She is alert and oriented to person, place, and time.  Skin: Skin is warm and dry.  Psychiatric: She has a normal mood and affect.    Prenatal labs: ABO, Rh: A/POS/-- (11/16 1122) Antibody: NEG (11/16 1122) Rubella: 51.9 (11/16 1122) RPR:  NON REAC (03/01 1024)  HBsAg: NEGATIVE (11/16 1122)  HIV: NON REACTIVE (03/01 1024)  GBS:     Assessment/Plan: A:  Twin IUP at [redacted]w[redacted]d       Active Labor        Previous C/S  P:  Plans trial of labor       Admit        May want epidural       Plan vaginal delivery         [facility] 10/07/2011, 2:06 AM

## 2011-10-07 NOTE — H&P (Signed)
Bedside US shows vtx/vtx. VBAC consent signed.  Agree with above note.  Vaun Hyndman H. 10/07/2011 8:12 PM

## 2011-10-08 LAB — CBC
MCV: 97 fL (ref 78.0–100.0)
Platelets: 125 10*3/uL — ABNORMAL LOW (ref 150–400)
RDW: 14.2 % (ref 11.5–15.5)
WBC: 11 10*3/uL — ABNORMAL HIGH (ref 4.0–10.5)

## 2011-10-08 MED ORDER — BENZOCAINE-MENTHOL 20-0.5 % EX AERO
1.0000 "application " | INHALATION_SPRAY | Freq: Four times a day (QID) | CUTANEOUS | Status: DC | PRN
  Filled 2011-10-08: qty 56

## 2011-10-08 NOTE — Progress Notes (Signed)
10/08/11 1110  Clinical Encounter Type  Visited With Patient and family together (Husband Jillian Mcguire and pt's mother)  Visit Type Initial;Social support;Spiritual support  Spiritual Encounters  Spiritual Needs Emotional  Stress Factors  Patient Stress Factors (Stress of vag and section deliveries; one twin in NICU)    Made contact with Jillian Mcguire and her husband Jillian Mcguire after seeing her sad, ashen face as he wheeled her to her room.  Learned that they had just visited son Jillian Heck "Azucena Kuba" in NICU and that [name] was feeling the understandable "perfect storm" of stress from having the twins in two different places [name] rooming in), worrying about Azucena Kuba, missing son Jillian Mcguire at home, dealing with postpartum hormones, beginning to nurse and pump colostrum (milk not in yet), recovering from both vaginal and c-section deliveries, recovering from nausea and swelling in her feet, and being very sleep deprived.    By the end of our visit, [name]'s affect was brighter.  She engaged fully in conversation and reflection, feeling some relief at getting to share her story. Husband Jillian Mcguire is encouraging, respectful of her parenting, and working to see the positives and to keep perspective in the midst of natural stress (particularly that Azucena Kuba is doing well, even as he needs to be in the NICU temporarily).  [name]'s mother lives in [place]; there is much support from local family.  Provided empathic listening, witness to family's story, encouragement.  Family is aware of ongoing chaplain availability, including on NICU, and voiced appreciation for visit.  [name], [place] Chaplain [phone]

## 2011-10-08 NOTE — Progress Notes (Signed)
UR chart review completed.  

## 2011-10-08 NOTE — Progress Notes (Signed)
Subjective: Postpartum Day #1: Cesarean Delivery Patient reports tolerating PO and no problems voiding. Slightly dizzy when sitting at edge of bed. Female doing well in NICU- weaned off O2. Breastfeeding going well with female.   Objective: Vital signs in last 24 hours: Temp:  [97.5 F (36.4 C)-98.4 F (36.9 C)] 97.8 F (36.6 C) (05/01 0650) Pulse Rate:  [55-125] 62  (05/01 0650) Resp:  [13-24] 16  (05/01 0650) BP: (80-125)/(34-82) 82/48 mmHg (05/01 0650) SpO2:  [81 %-100 %] 96 % (05/01 0650) Weight:  [69.4 kg (153 lb)] 69.4 kg (153 lb) (04/30 1204)  Physical Exam:  General: alert, cooperative and mild distress Lochia: appropriate Uterine Fundus: firm Incision: dsg intact; no drainage DVT Evaluation: No evidence of DVT seen on physical exam.   Basename 10/08/11 0557 10/07/11 0100  HGB 8.6* 11.9*  HCT 25.7* 36.0    Assessment/Plan: Status post Cesarean section. Anemia  Will get orthostatic vitals.  Doing well postoperatively.  Continue current care.  Cam Hai 10/08/2011, 8:11 AM

## 2011-10-09 ENCOUNTER — Encounter (HOSPITAL_COMMUNITY): Payer: Self-pay | Admitting: Obstetrics & Gynecology

## 2011-10-10 DIAGNOSIS — Z98891 History of uterine scar from previous surgery: Secondary | ICD-10-CM

## 2011-10-10 MED ORDER — IBUPROFEN 600 MG PO TABS: 600.0000 mg | ORAL_TABLET | Freq: Four times a day (QID) | ORAL | Status: AC | PRN

## 2011-10-10 MED ORDER — OXYCODONE-ACETAMINOPHEN 5-325 MG PO TABS: 1.0000 | ORAL_TABLET | ORAL | Status: AC | PRN

## 2011-10-10 NOTE — Discharge Summary (Signed)
Obstetric Discharge Summary Reason for Admission: onset of labor Prenatal Procedures: NST and ultrasound Intrapartum Procedures: spontaneous vaginal delivery and cesarean: low cervical, transverse Postpartum Procedures: none Complications-Operative and Postpartum: 2nd degree perineal laceration Hemoglobin  Date Value Range Status  10/08/2011 8.6* 12.0-15.0 (g/dL) Final     REPEATED TO VERIFY     DELTA CHECK NOTED     HCT  Date Value Range Status  10/08/2011 25.7* 36.0-46.0 (%) Final  Subjective: No orthostatic sx. Breastfeeding and pumping. Female in NICU having feeding difficulties and may need cranial surgery later. Has help. Abdominal incisional pain mild, more soreness at perineal site. On stool softeners. No orthostatic sx. Has help at home. Plans abstinence for 6 wks at least.   Physical Exam:  General: alert, cooperative, appears stated age and no distress Lochia: appropriate Uterine Fundus: firm @ U-2 with large diastasis Incision: healing well, no significant drainage, no significant erythema, steristrips intact DVT Evaluation: No evidence of DVT seen on physical exam.  Discharge Diagnoses:  [id number] PPD# 3 VBAC@ 36wks /POD# 3 RLTCS for cord prolapse ABL anemia  Discharge Information: Date: 10/10/2011 Activity: per practice instructions Diet: routine Medications: PNV, Ibuprofen, Colace, Iron and Percocet Condition: stable Instructions: refer to practice specific booklet Discharge to: home Follow-up Information    Follow up with [facility] in 2 weeks.   Contact information:   [address] [place] [address]          Newborn Data:   Jillian, Mcguire [[id number]]  Live born female  Birth Weight: 6 lb 10.5 oz (3019 g) APGAR: 9, 9   Jillian, Mcguire [[id number]]  Live born female  Birth Weight: 6 lb 11.6 oz (3051 g) APGAR: 5, 7  Home with Twin A female home with mother, Twin B female in  NICU.  Jillian Mcguire 10/10/2011, 10:14 AM

## 2011-10-10 NOTE — Discharge Summary (Signed)
Attestation of Attending Supervision of Advanced Practitioner: Evaluation and management procedures were performed by the OB Fellow/PA/CNM/NP under my supervision and collaboration. Chart reviewed, and agree with management and plan.  Maressa Apollo, M.D. 10/10/2011 11:30 AM   

## 2011-10-10 NOTE — Discharge Instructions (Signed)

## 2011-10-16 NOTE — MAU Provider Note (Signed)
Medical Screening exam and patient care preformed by advanced practice provider.  Agree with the above management.  

## 2011-11-12 ENCOUNTER — Encounter: Payer: Self-pay | Admitting: Obstetrics & Gynecology

## 2011-11-12 ENCOUNTER — Ambulatory Visit (INDEPENDENT_AMBULATORY_CARE_PROVIDER_SITE_OTHER): Payer: Medicaid Other | Admitting: Obstetrics & Gynecology

## 2011-11-12 VITALS — BP 104/71 | HR 64 | Temp 98.5°F | Resp 16 | Ht 65.0 in | Wt 130.0 lb

## 2011-11-12 DIAGNOSIS — E282 Polycystic ovarian syndrome: Secondary | ICD-10-CM

## 2011-11-12 NOTE — Progress Notes (Signed)
  Subjective:     Jillian Mcguire is a 31 y.o. female who presents for a postpartum visit. She is 5 weeks postpartum following a VBAC and rpt emergent c/s for cord prolpase. I have fully reviewed the prenatal and intrapartum course. The delivery was at term. Outcome: VBAC and emergency c/s (twins). Anesthesia: epidural. Postpartum course has been uncomplicated. Baby's course has been baby B (boy) has fused suture lines not diagnosed prenatally. Baby is feeding by breast. Bleeding no bleeding. Bowel function is normal. Bladder function is normal. Patient is not sexually active. Contraception method is abstinence. Postpartum depression screening: negative.  The following portions of the patient's history were reviewed and updated as appropriate: allergies, current medications, past family history, past medical history, past social history, past surgical history and problem list.  Review of Systems A comprehensive review of systems was negative except for: irritation of vagina from suture knot   Objective:    BP 104/71  Pulse 64  Temp(Src) 98.5 F (36.9 C) (Oral)  Resp 16  Ht 5\' 5"  (1.651 m)  Wt 130 lb (58.968 kg)  BMI 21.63 kg/m2  Breastfeeding? Yes  General:  alert, cooperative and no distress   Breasts:  refused  Lungs: nml mvmt  Heart:  not done  Abdomen: soft, non-tender; bowel sounds normal; no masses,  no organomegaly   Vulva:  normal  Vagina: vagina positive for suture know and mild erythema  Cervix:  no lesions  Corpus: normal size, contour, position, consistency, mobility, non-tender  Adnexa:  no mass, fullness, tenderness  Rectal Exam: Not performed.        Assessment:    nml postpartum exam. Pap smear not done at today's visit.   Plan:    1. Contraception: abstinence 2. Condoms.  Pt will need withdrawal bleeds q 3 months due to annovulation 3. Follow up in: 10 weeks or as needed.

## 2014-04-10 ENCOUNTER — Encounter: Payer: Self-pay | Admitting: Obstetrics & Gynecology

## 2014-12-07 ENCOUNTER — Other Ambulatory Visit: Payer: Self-pay | Admitting: Advanced Practice Midwife

## 2014-12-07 LAB — OB RESULTS CONSOLE GC/CHLAMYDIA
Chlamydia: NEGATIVE
Gonorrhea: NEGATIVE

## 2014-12-08 ENCOUNTER — Encounter: Payer: Self-pay | Admitting: Advanced Practice Midwife

## 2014-12-08 ENCOUNTER — Ambulatory Visit (INDEPENDENT_AMBULATORY_CARE_PROVIDER_SITE_OTHER): Payer: Medicaid Other | Admitting: Advanced Practice Midwife

## 2014-12-08 VITALS — BP 104/69 | HR 74 | Wt 121.0 lb

## 2014-12-08 DIAGNOSIS — Z3481 Encounter for supervision of other normal pregnancy, first trimester: Secondary | ICD-10-CM | POA: Diagnosis not present

## 2014-12-08 DIAGNOSIS — O3421 Maternal care for scar from previous cesarean delivery: Secondary | ICD-10-CM

## 2014-12-08 DIAGNOSIS — Z3491 Encounter for supervision of normal pregnancy, unspecified, first trimester: Secondary | ICD-10-CM

## 2014-12-08 DIAGNOSIS — O09891 Supervision of other high risk pregnancies, first trimester: Secondary | ICD-10-CM

## 2014-12-08 DIAGNOSIS — O34219 Maternal care for unspecified type scar from previous cesarean delivery: Secondary | ICD-10-CM

## 2014-12-08 DIAGNOSIS — O09211 Supervision of pregnancy with history of pre-term labor, first trimester: Secondary | ICD-10-CM

## 2014-12-08 NOTE — Patient Instructions (Signed)
First Trimester of Pregnancy The first trimester of pregnancy is from week 1 until the end of week 12 (months 1 through 3). A week after a sperm fertilizes an egg, the egg will implant on the wall of the uterus. This embryo will begin to develop into a baby. Genes from you and your partner are forming the baby. The female genes determine whether the baby is a boy or a girl. At 6-8 weeks, the eyes and face are formed, and the heartbeat can be seen on ultrasound. At the end of 12 weeks, all the baby's organs are formed.  Now that you are pregnant, you will want to do everything you can to have a healthy baby. Two of the most important things are to get good prenatal care and to follow your health care provider's instructions. Prenatal care is all the medical care you receive before the baby's birth. This care will help prevent, find, and treat any problems during the pregnancy and childbirth. BODY CHANGES Your body goes through many changes during pregnancy. The changes vary from woman to woman.   You may gain or lose a couple of pounds at first.  You may feel sick to your stomach (nauseous) and throw up (vomit). If the vomiting is uncontrollable, call your health care provider.  You may tire easily.  You may develop headaches that can be relieved by medicines approved by your health care provider.  You may urinate more often. Painful urination may mean you have a bladder infection.  You may develop heartburn as a result of your pregnancy.  You may develop constipation because certain hormones are causing the muscles that push waste through your intestines to slow down.  You may develop hemorrhoids or swollen, bulging veins (varicose veins).  Your breasts may begin to grow larger and become tender. Your nipples may stick out more, and the tissue that surrounds them (areola) may become darker.  Your gums may bleed and may be sensitive to brushing and flossing.  Dark spots or blotches (chloasma,  mask of pregnancy) may develop on your face. This will likely fade after the baby is born.  Your menstrual periods will stop.  You may have a loss of appetite.  You may develop cravings for certain kinds of food.  You may have changes in your emotions from day to day, such as being excited to be pregnant or being concerned that something may go wrong with the pregnancy and baby.  You may have more vivid and strange dreams.  You may have changes in your hair. These can include thickening of your hair, rapid growth, and changes in texture. Some women also have hair loss during or after pregnancy, or hair that feels dry or thin. Your hair will most likely return to normal after your baby is born. WHAT TO EXPECT AT YOUR PRENATAL VISITS During a routine prenatal visit:  You will be weighed to make sure you and the baby are growing normally.  Your blood pressure will be taken.  Your abdomen will be measured to track your baby's growth.  The fetal heartbeat will be listened to starting around week 10 or 12 of your pregnancy.  Test results from any previous visits will be discussed. Your health care provider may ask you:  How you are feeling.  If you are feeling the baby move.  If you have had any abnormal symptoms, such as leaking fluid, bleeding, severe headaches, or abdominal cramping.  If you have any questions. Other tests   that may be performed during your first trimester include:  Blood tests to find your blood type and to check for the presence of any previous infections. They will also be used to check for low iron levels (anemia) and Rh antibodies. Later in the pregnancy, blood tests for diabetes will be done along with other tests if problems develop.  Urine tests to check for infections, diabetes, or protein in the urine.  An ultrasound to confirm the proper growth and development of the baby.  An amniocentesis to check for possible genetic problems.  Fetal screens for  spina bifida and Down syndrome.  You may need other tests to make sure you and the baby are doing well. HOME CARE INSTRUCTIONS  Medicines  Follow your health care provider's instructions regarding medicine use. Specific medicines may be either safe or unsafe to take during pregnancy.  Take your prenatal vitamins as directed.  If you develop constipation, try taking a stool softener if your health care provider approves. Diet  Eat regular, well-balanced meals. Choose a variety of foods, such as meat or vegetable-based protein, fish, milk and low-fat dairy products, vegetables, fruits, and whole grain breads and cereals. Your health care provider will help you determine the amount of weight gain that is right for you.  Avoid raw meat and uncooked cheese. These carry germs that can cause birth defects in the baby.  Eating four or five small meals rather than three large meals a day may help relieve nausea and vomiting. If you start to feel nauseous, eating a few soda crackers can be helpful. Drinking liquids between meals instead of during meals also seems to help nausea and vomiting.  If you develop constipation, eat more high-fiber foods, such as fresh vegetables or fruit and whole grains. Drink enough fluids to keep your urine clear or pale yellow. Activity and Exercise  Exercise only as directed by your health care provider. Exercising will help you:  Control your weight.  Stay in shape.  Be prepared for labor and delivery.  Experiencing pain or cramping in the lower abdomen or low back is a good sign that you should stop exercising. Check with your health care provider before continuing normal exercises.  Try to avoid standing for long periods of time. Move your legs often if you must stand in one place for a long time.  Avoid heavy lifting.  Wear low-heeled shoes, and practice good posture.  You may continue to have sex unless your health care provider directs you  otherwise. Relief of Pain or Discomfort  Wear a good support bra for breast tenderness.   Take warm sitz baths to soothe any pain or discomfort caused by hemorrhoids. Use hemorrhoid cream if your health care provider approves.   Rest with your legs elevated if you have leg cramps or low back pain.  If you develop varicose veins in your legs, wear support hose. Elevate your feet for 15 minutes, 3-4 times a day. Limit salt in your diet. Prenatal Care  Schedule your prenatal visits by the twelfth week of pregnancy. They are usually scheduled monthly at first, then more often in the last 2 months before delivery.  Write down your questions. Take them to your prenatal visits.  Keep all your prenatal visits as directed by your health care provider. Safety  Wear your seat belt at all times when driving.  Make a list of emergency phone numbers, including numbers for family, friends, the hospital, and police and fire departments. General Tips    Ask your health care provider for a referral to a local prenatal education class. Begin classes no later than at the beginning of month 6 of your pregnancy.  Ask for help if you have counseling or nutritional needs during pregnancy. Your health care provider can offer advice or refer you to specialists for help with various needs.  Do not use hot tubs, steam rooms, or saunas.  Do not douche or use tampons or scented sanitary pads.  Do not cross your legs for long periods of time.  Avoid cat litter boxes and soil used by cats. These carry germs that can cause birth defects in the baby and possibly loss of the fetus by miscarriage or stillbirth.  Avoid all smoking, herbs, alcohol, and medicines not prescribed by your health care provider. Chemicals in these affect the formation and growth of the baby.  Schedule a dentist appointment. At home, brush your teeth with a soft toothbrush and be gentle when you floss. SEEK MEDICAL CARE IF:   You have  dizziness.  You have mild pelvic cramps, pelvic pressure, or nagging pain in the abdominal area.  You have persistent nausea, vomiting, or diarrhea.  You have a bad smelling vaginal discharge.  You have pain with urination.  You notice increased swelling in your face, hands, legs, or ankles. SEEK IMMEDIATE MEDICAL CARE IF:   You have a fever.  You are leaking fluid from your vagina.  You have spotting or bleeding from your vagina.  You have severe abdominal cramping or pain.  You have rapid weight gain or loss.  You vomit blood or material that looks like coffee grounds.  You are exposed to German measles and have never had them.  You are exposed to fifth disease or chickenpox.  You develop a severe headache.  You have shortness of breath.  You have any kind of trauma, such as from a fall or a car accident. Document Released: 05/20/2001 Document Revised: 10/10/2013 Document Reviewed: 04/05/2013 [employer] Patient Information 2015 [employer]. This information is not intended to replace advice given to you by your health care provider. Make sure you discuss any questions you have with your health care provider.  

## 2014-12-08 NOTE — Progress Notes (Signed)
Subjective:    Jillian Mcguire is a [id number] [redacted]w[redacted]d being seen today for her first obstetrical visit.  Her obstetrical history is significant for previous C/S x 2 and VBAC x 1 and PTD of twins at 36 weeks (spontaneous onset of labor) . Patient does intend to breast feed. Pregnancy history fully reviewed.  Patient reports vaginal itching. Denies fever, chills, vaginal discharge, vaginal bleeding, cramping.   Filed Vitals:   12/08/14 0920  BP: 104/69  Pulse: 74  Weight: 121 lb (54.885 kg)    HISTORY: OB History  Gravida Para Term Preterm AB SAB TAB Ectopic Multiple Living  0  1 3    # Outcome Date GA Lbr Len/2nd Weight Sex Delivery Anes PTL Lv  5 Current           4A Preterm 10/07/11 [redacted]w[redacted]d 25:28 / 02:34 6 lb 10.5 oz (3.019 kg) F Vag-Spont EPI  Y     Comments: WNL  4B Preterm 10/07/11 [redacted]w[redacted]d 25:28 / 03:02 6 lb 11.6 oz (3.051 kg) M CS-LTranv EPI  Y     Comments: bruiisng aroung the mouth, dolicocephaly  3 SAB 11/11/10          2 SAB 09/09/10          1 Term 03/09/09 [redacted]w[redacted]d  8 lb 9 oz (3.884 kg) M CS-Unspec Gen N Y     Comments: induction for convenience; fetal distress     Past Medical History  Diagnosis Date  . PONV (postoperative nausea and vomiting)     Pt was put under general for CSection due to mal function with epidural  . PCOS (polycystic ovarian syndrome)   . Lactose intolerance   . Anxiety    Past Surgical History  Procedure Laterality Date  . Tonsillectomy  2005  . Breast enhancement surgery  2007  . Breast biopsy  2009  . Cholecystectomy  2009  . Cesarean section  2010  . Breast fibroid    . Cesarean section  10/07/2011    Procedure: CESAREAN SECTION;  Surgeon: Adam Phenix, MD;  Location: [facility];  Service: Gynecology;  Laterality: N/A;  Repair of second degree vaginal tear.   Family History  Problem Relation Age of Onset  . Heart disease Mother   . Fibroids Mother   . Endometriosis Mother   . Cancer Maternal Grandmother     ovarian  .  Hypertension Maternal Grandfather   . Diabetes Maternal Grandfather   . Cancer Paternal Grandfather     brain tumor  . Cancer Paternal Grandmother     uterine  . Anesthesia problems Neg Hx      Exam    Uterus:   Unable to palpate abdominally   Pelvic Exam:    Perineum: No Hemorrhoids   Vulva: normal   Vagina:  normal mucosa, normal discharge. Wet prep done.   pH: NA   Cervix: Not assessed   Adnexa: not evaluated   Bony Pelvis: proven to 6 lb 10 oz  System: Breast:  normal appearance, no masses or tenderness, Implants palpated   Skin: normal coloration and turgor, no rashes    Neurologic: oriented, normal mood, gait normal; reflexes normal and symmetric, grossly non-focal   Extremities: normal strength, tone, and muscle mass, No edema   HEENT sclera clear, anicteric, neck supple with midline trachea and thyroid without masses   Mouth/Teeth mucous membranes moist, pharynx normal without lesions and dental hygiene good   Neck supple  and no masses   Cardiovascular: regular rate and rhythm, no murmurs or gallops   Respiratory:  appears well, vitals normal, no respiratory distress, acyanotic, normal RR, neck free of mass or lymphadenopathy, chest clear, no wheezing, crepitations, rhonchi, normal symmetric air entry   Abdomen: soft, non-tender; bowel sounds normal; no masses,  no organomegaly   Urinary: urethral meatus normal      Assessment:    Pregnancy: [id number] Patient Active Problem List   Diagnosis Date Noted  . Supervision of normal pregnancy in first trimester 12/08/2014  . History of preterm delivery, currently pregnant in first trimester 12/08/2014  . PCO (polycystic ovaries) 11/12/2011  . Previous cesarean delivery affecting pregnancy, antepartum 08/08/2011   1. Previous cesarean delivery affecting pregnancy, antepartum  2. Supervision of normal pregnancy in first trimester  - Prenatal (OB Panel) - HIV antibody (with reflex) - CULTURE, URINE COMPREHENSIVE -  GC/Chlamydia Probe Amp - WET PREP FOR TRICH, YEAST, CLUE  3. History of preterm delivery, currently pregnant in first trimester   Plan:     Initial labs drawn. Prenatal vitamins. Problem list reviewed and updated. Genetic Screening discussed First Screen: declined. All.  Ultrasound discussed; fetal survey: requested.  Follow up in 4 weeks. Baby scripts patient. Strongly desires VBAC. Discussed that our practice will consider it w/ a patient who has had C/S x 2 and vaginal delivery, but some providers are not comfortable. There may be a situation where the provider on call will recommend against it. Also will avoid IOL or augmentation w/ meds if at all possible. Discussed increased risk of uterine rupture.    Dorathy KinsmanSMITH, [name] 12/08/2014

## 2014-12-08 NOTE — Assessment & Plan Note (Signed)
BABYSCRIPTS PATIENT: [x ] initial, [ ] 12, [ ] 20, [ ] 28, [ ] 32, [ ] 36, [ ] 38, [ ] 39, [ ] 40 

## 2014-12-08 NOTE — Progress Notes (Signed)
Pt here today with spouse for NOB intake.  Bedside U/S shows IUP with GA  956w5d and CRL 21.752mm

## 2014-12-09 ENCOUNTER — Other Ambulatory Visit: Payer: Self-pay | Admitting: Advanced Practice Midwife

## 2014-12-09 DIAGNOSIS — N760 Acute vaginitis: Principal | ICD-10-CM

## 2014-12-09 DIAGNOSIS — B9689 Other specified bacterial agents as the cause of diseases classified elsewhere: Secondary | ICD-10-CM

## 2014-12-09 LAB — WET PREP FOR TRICH, YEAST, CLUE
Trich, Wet Prep: NONE SEEN
YEAST WET PREP: NONE SEEN

## 2014-12-09 LAB — HIV ANTIBODY (ROUTINE TESTING W REFLEX): HIV: NONREACTIVE

## 2014-12-09 MED ORDER — CLINDAMYCIN HCL 300 MG PO CAPS: 300.0000 mg | ORAL_CAPSULE | Freq: Two times a day (BID) | ORAL | Status: DC

## 2014-12-09 NOTE — Progress Notes (Signed)
Pt C/O vaginal discharge, irritation. Rx Clinda. (Flagyl allergy)

## 2014-12-10 LAB — CULTURE, URINE COMPREHENSIVE
COLONY COUNT: NO GROWTH
ORGANISM ID, BACTERIA: NO GROWTH

## 2014-12-12 LAB — OBSTETRIC PANEL
ANTIBODY SCREEN: NEGATIVE
Basophils Absolute: 0 10*3/uL (ref 0.0–0.1)
Basophils Relative: 0 % (ref 0–1)
EOS ABS: 0.1 10*3/uL (ref 0.0–0.7)
EOS PCT: 1 % (ref 0–5)
HEMATOCRIT: 42.7 % (ref 36.0–46.0)
Hemoglobin: 14 g/dL (ref 12.0–15.0)
Hepatitis B Surface Ag: NEGATIVE
Lymphocytes Relative: 18 % (ref 12–46)
Lymphs Abs: 1.4 10*3/uL (ref 0.7–4.0)
MCH: 31.5 pg (ref 26.0–34.0)
MCHC: 32.8 g/dL (ref 30.0–36.0)
MCV: 96.2 fL (ref 78.0–100.0)
MONO ABS: 0.5 10*3/uL (ref 0.1–1.0)
MONOS PCT: 7 % (ref 3–12)
MPV: 10.5 fL (ref 8.6–12.4)
NEUTROS ABS: 5.7 10*3/uL (ref 1.7–7.7)
Neutrophils Relative %: 74 % (ref 43–77)
Platelets: 240 10*3/uL (ref 150–400)
RBC: 4.44 MIL/uL (ref 3.87–5.11)
RDW: 12.6 % (ref 11.5–15.5)
RUBELLA: 2.94 {index} — AB (ref <0.90)
Rh Type: POSITIVE
WBC: 7.7 10*3/uL (ref 4.0–10.5)

## 2014-12-13 LAB — GC/CHLAMYDIA PROBE AMP
CT Probe RNA: NEGATIVE
GC Probe RNA: NEGATIVE

## 2015-01-05 ENCOUNTER — Ambulatory Visit (INDEPENDENT_AMBULATORY_CARE_PROVIDER_SITE_OTHER): Payer: Medicaid Other | Admitting: Advanced Practice Midwife

## 2015-01-05 VITALS — BP 98/62 | HR 66 | Wt 121.0 lb

## 2015-01-05 DIAGNOSIS — Z3492 Encounter for supervision of normal pregnancy, unspecified, second trimester: Secondary | ICD-10-CM

## 2015-01-05 DIAGNOSIS — Z3482 Encounter for supervision of other normal pregnancy, second trimester: Secondary | ICD-10-CM

## 2015-01-05 NOTE — Patient Instructions (Signed)
Second Trimester of Pregnancy The second trimester is from week 13 through week 28, months 4 through 6. The second trimester is often a time when you feel your best. Your body has also adjusted to being pregnant, and you begin to feel better physically. Usually, morning sickness has lessened or quit completely, you may have more energy, and you may have an increase in appetite. The second trimester is also a time when the fetus is growing rapidly. At the end of the sixth month, the fetus is about 9 inches long and weighs about 1 pounds. You will likely begin to feel the baby move (quickening) between 18 and 20 weeks of the pregnancy. BODY CHANGES Your body goes through many changes during pregnancy. The changes vary from woman to woman.   Your weight will continue to increase. You will notice your lower abdomen bulging out.  You may begin to get stretch marks on your hips, abdomen, and breasts.  You may develop headaches that can be relieved by medicines approved by your health care provider.  You may urinate more often because the fetus is pressing on your bladder.  You may develop or continue to have heartburn as a result of your pregnancy.  You may develop constipation because certain hormones are causing the muscles that push waste through your intestines to slow down.  You may develop hemorrhoids or swollen, bulging veins (varicose veins).  You may have back pain because of the weight gain and pregnancy hormones relaxing your joints between the bones in your pelvis and as a result of a shift in weight and the muscles that support your balance.  Your breasts will continue to grow and be tender.  Your gums may bleed and may be sensitive to brushing and flossing.  Dark spots or blotches (chloasma, mask of pregnancy) may develop on your face. This will likely fade after the baby is born.  A dark line from your belly button to the pubic area (linea nigra) may appear. This will likely fade  after the baby is born.  You may have changes in your hair. These can include thickening of your hair, rapid growth, and changes in texture. Some women also have hair loss during or after pregnancy, or hair that feels dry or thin. Your hair will most likely return to normal after your baby is born. WHAT TO EXPECT AT YOUR PRENATAL VISITS During a routine prenatal visit:  You will be weighed to make sure you and the fetus are growing normally.  Your blood pressure will be taken.  Your abdomen will be measured to track your baby's growth.  The fetal heartbeat will be listened to.  Any test results from the previous visit will be discussed. Your health care provider may ask you:  How you are feeling.  If you are feeling the baby move.  If you have had any abnormal symptoms, such as leaking fluid, bleeding, severe headaches, or abdominal cramping.  If you have any questions. Other tests that may be performed during your second trimester include:  Blood tests that check for:  Low iron levels (anemia).  Gestational diabetes (between 24 and 28 weeks).  Rh antibodies.  Urine tests to check for infections, diabetes, or protein in the urine.  An ultrasound to confirm the proper growth and development of the baby.  An amniocentesis to check for possible genetic problems.  Fetal screens for spina bifida and Down syndrome. HOME CARE INSTRUCTIONS   Avoid all smoking, herbs, alcohol, and unprescribed   drugs. These chemicals affect the formation and growth of the baby.  Follow your health care provider's instructions regarding medicine use. There are medicines that are either safe or unsafe to take during pregnancy.  Exercise only as directed by your health care provider. Experiencing uterine cramps is a good sign to stop exercising.  Continue to eat regular, healthy meals.  Wear a good support bra for breast tenderness.  Do not use hot tubs, steam rooms, or saunas.  Wear your  seat belt at all times when driving.  Avoid raw meat, uncooked cheese, cat litter boxes, and soil used by cats. These carry germs that can cause birth defects in the baby.  Take your prenatal vitamins.  Try taking a stool softener (if your health care provider approves) if you develop constipation. Eat more high-fiber foods, such as fresh vegetables or fruit and whole grains. Drink plenty of fluids to keep your urine clear or pale yellow.  Take warm sitz baths to soothe any pain or discomfort caused by hemorrhoids. Use hemorrhoid cream if your health care provider approves.  If you develop varicose veins, wear support hose. Elevate your feet for 15 minutes, 3-4 times a day. Limit salt in your diet.  Avoid heavy lifting, wear low heel shoes, and practice good posture.  Rest with your legs elevated if you have leg cramps or low back pain.  Visit your dentist if you have not gone yet during your pregnancy. Use a soft toothbrush to brush your teeth and be gentle when you floss.  A sexual relationship may be continued unless your health care provider directs you otherwise.  Continue to go to all your prenatal visits as directed by your health care provider. SEEK MEDICAL CARE IF:   You have dizziness.  You have mild pelvic cramps, pelvic pressure, or nagging pain in the abdominal area.  You have persistent nausea, vomiting, or diarrhea.  You have a bad smelling vaginal discharge.  You have pain with urination. SEEK IMMEDIATE MEDICAL CARE IF:   You have a fever.  You are leaking fluid from your vagina.  You have spotting or bleeding from your vagina.  You have severe abdominal cramping or pain.  You have rapid weight gain or loss.  You have shortness of breath with chest pain.  You notice sudden or extreme swelling of your face, hands, ankles, feet, or legs.  You have not felt your baby move in over an hour.  You have severe headaches that do not go away with  medicine.  You have vision changes. Document Released: 05/20/2001 Document Revised: 05/31/2013 Document Reviewed: 07/27/2012 [employer] Patient Information 2015 [employer]. This information is not intended to replace advice given to you by your health care provider. Make sure you discuss any questions you have with your health care provider.  

## 2015-01-05 NOTE — Progress Notes (Signed)
Subjective:  Jillian Mcguire is a 34 y.o. [phone] at [redacted]w[redacted]d being seen today for ongoing prenatal care.  Patient reports fatigue.   .  Vag. Bleeding: None.  . Denies leaking of fluid.   The following portions of the patient's history were reviewed and updated as appropriate: allergies, current medications, past family history, past medical history, past social history, past surgical history and problem list.   Reviewed normal NOB labs. Declines genetic screening.   Objective:   Filed Vitals:   01/05/15 0921  BP: 98/62  Pulse: 66  Weight: 121 lb (54.885 kg)    Fetal Status: Fetal Heart Rate (bpm): 154         General:  Alert, oriented and cooperative. Patient is in no acute distress.  Skin: Skin is warm and dry. No rash noted.   Cardiovascular: Normal heart rate noted  Respiratory: Normal respiratory effort, no problems with respiration noted  Abdomen: Soft, gravid, appropriate for gestational age.       Vaginal: Vag. Bleeding: None.    Vag D/C Character: Thin  Cervix: Not evaluated        Extremities: Normal range of motion.  Edema: None  Mental Status: Normal mood and affect. Normal behavior. Normal judgment and thought content.   Urinalysis: Urine Protein: Negative Urine Glucose: Negative  Assessment and Plan:  Pregnancy: [id number] at [redacted]w[redacted]d  1. Supervision of normal pregnancy in second trimester  - US OB Comp + 14 Wk; Future  Preterm labor symptoms and general obstetric precautions including but not limited to vaginal bleeding, contractions, leaking of fluid and fetal movement were reviewed in detail with the patient. Please refer to After Visit Summary for other counseling recommendations.  Return in about 7 days (around 01/12/2015).     Dorathy Kinsman, CNM

## 2015-02-14 ENCOUNTER — Other Ambulatory Visit: Payer: Self-pay | Admitting: Advanced Practice Midwife

## 2015-02-14 ENCOUNTER — Ambulatory Visit (HOSPITAL_COMMUNITY)
Admission: RE | Admit: 2015-02-14 | Discharge: 2015-02-14 | Disposition: A | Discharge status: Home / Self Care | Payer: Medicaid Other | Source: Ambulatory Visit | Attending: Advanced Practice Midwife | Admitting: Advanced Practice Midwife

## 2015-02-14 DIAGNOSIS — O34219 Maternal care for unspecified type scar from previous cesarean delivery: Secondary | ICD-10-CM

## 2015-02-14 DIAGNOSIS — Z3492 Encounter for supervision of normal pregnancy, unspecified, second trimester: Secondary | ICD-10-CM

## 2015-02-14 DIAGNOSIS — Z87798 Personal history of other (corrected) congenital malformations: Secondary | ICD-10-CM

## 2015-02-14 DIAGNOSIS — O09212 Supervision of pregnancy with history of pre-term labor, second trimester: Secondary | ICD-10-CM | POA: Diagnosis present

## 2015-02-14 DIAGNOSIS — Z3A18 18 weeks gestation of pregnancy: Secondary | ICD-10-CM | POA: Diagnosis not present

## 2015-02-14 DIAGNOSIS — Z3689 Encounter for other specified antenatal screening: Secondary | ICD-10-CM

## 2015-02-14 DIAGNOSIS — Z36 Encounter for antenatal screening of mother: Secondary | ICD-10-CM | POA: Diagnosis not present

## 2015-02-14 DIAGNOSIS — O3421 Maternal care for scar from previous cesarean delivery: Secondary | ICD-10-CM | POA: Insufficient documentation

## 2015-02-14 DIAGNOSIS — O09892 Supervision of other high risk pregnancies, second trimester: Secondary | ICD-10-CM

## 2015-02-23 ENCOUNTER — Ambulatory Visit (INDEPENDENT_AMBULATORY_CARE_PROVIDER_SITE_OTHER): Payer: Medicaid Other | Admitting: Advanced Practice Midwife

## 2015-02-23 VITALS — BP 93/61 | HR 73 | Wt 128.0 lb

## 2015-02-23 DIAGNOSIS — Z3481 Encounter for supervision of other normal pregnancy, first trimester: Secondary | ICD-10-CM

## 2015-02-23 DIAGNOSIS — O4402 Placenta previa specified as without hemorrhage, second trimester: Secondary | ICD-10-CM

## 2015-02-23 DIAGNOSIS — Z3491 Encounter for supervision of normal pregnancy, unspecified, first trimester: Secondary | ICD-10-CM

## 2015-02-23 DIAGNOSIS — O4442 Low lying placenta NOS or without hemorrhage, second trimester: Secondary | ICD-10-CM

## 2015-02-23 NOTE — Patient Instructions (Signed)
Placenta Previa  Placenta previa is a condition in pregnant women where the placenta implants in the lower part of the uterus. The placenta either partially or completely covers the opening to the cervix. This is a problem because the baby must pass through the cervix during delivery. There are three types of placenta previa. They include:   Marginal placenta previa. The placenta is near the cervix, but does not cover the opening.  Partial placenta previa. The placenta covers part of the cervical opening.  Complete placenta previa. The placenta covers the entire cervical opening.  Depending on the type of placenta previa, there is a chance the placenta may move into a normal position and no longer cover the cervix as the pregnancy progresses. It is important to keep all prenatal visits with your caregiver.  RISK FACTORS You may be more likely to develop placenta previa if you:   Are carrying more than one baby (multiples).   Have an abnormally shaped uterus.   Have scars on the lining of the uterus.   Had previous surgeries involving the uterus, such as a cesarean delivery.   Have delivered a baby previously.   Have a history of placenta previa.   Have smoked or used cocaine during pregnancy.   Are age 76 or older during pregnancy.  SYMPTOMS The main symptom of placenta previa is sudden, painless vaginal bleeding during the second half of pregnancy. The amount of bleeding can be light to very heavy. The bleeding may stop on its own, but almost always returns. Cramping, regular contractions, abdominal pain, and lower back pain can also occur with placenta previa.  DIAGNOSIS Placenta previa can be diagnosed through an ultrasound by finding where the placenta is located. The ultrasound may find placenta previa either during a routine prenatal visit or after vaginal bleeding is noticed. If you are diagnosed with placenta previa, your caregiver may avoid vaginal exams to reduce the  risk of heavy bleeding. There is a chance that placenta previa may not be diagnosed until bleeding occurs during labor.  TREATMENT Specific treatment depends on:   How much you are bleeding or if the bleeding has stopped.  How far along you are in your pregnancy.   The condition of the baby.   The location of the baby and placenta.   The type of placenta previa.  Depending on the factors above, your caregiver may recommend:   Decreased activity.   Bed rest at home or in the hospital.  Pelvic rest. This means no sex, using tampons, douching, pelvic exams, or placing anything into the vagina.  A blood transfusion to replace maternal blood loss.  A cesarean delivery if the bleeding is heavy and cannot be controlled or the placenta completely covers the cervix.  Medication to stop premature labor or mature the fetal lungs if delivery is needed before the pregnancy is full term.  WHEN SHOULD YOU SEEK IMMEDIATE MEDICAL CARE IF YOU ARE SENT HOME WITH PLACENTA PREVIA? Seek immediate medical care if you show any symptoms of placenta previa. You will need to go to the hospital to get checked immediately. Again, those symptoms are:  Sudden, painless vaginal bleeding, even a small amount.  Cramping or regular contractions.  Lower back or abdominal pain. Document Released: 05/26/2005 Document Revised: 01/26/2013 Document Reviewed: 08/27/2012 [facility] Patient Information 2015 [place], [place]. This information is not intended to replace advice given to you by your health care provider. Make sure you discuss any questions you have with your health  care provider.   Tdap Vaccine (Tetanus, Diphtheria, Pertussis): What You Need to Know 1. Why get vaccinated? Tetanus, diphtheria and pertussis can be very serious diseases, even for adolescents and adults. Tdap vaccine can protect [place] from these diseases. TETANUS (Lockjaw) causes painful muscle tightening and stiffness, usually all over the  body.  It can lead to tightening of muscles in the head and neck so you can't open your mouth, swallow, or sometimes even breathe. Tetanus kills about 1 out of 5 people who are infected. DIPHTHERIA can cause a thick coating to form in the back of the throat.  It can lead to breathing problems, paralysis, heart failure, and death. PERTUSSIS (Whooping Cough) causes severe coughing spells, which can cause difficulty breathing, vomiting and disturbed sleep.  It can also lead to weight loss, incontinence, and rib fractures. Up to 2 in 100 adolescents and 5 in 100 adults with pertussis are hospitalized or have complications, which could include pneumonia or death. These diseases are caused by bacteria. Diphtheria and pertussis are spread from person to person through coughing or sneezing. Tetanus enters the body through cuts, scratches, or wounds. Before vaccines, the [place] saw as many as 200,000 cases a year of diphtheria and pertussis, and hundreds of cases of tetanus. Since vaccination began, tetanus and diphtheria have dropped by about 99% and pertussis by about 80%. 2. Tdap vaccine Tdap vaccine can protect adolescents and adults from tetanus, diphtheria, and pertussis. One dose of Tdap is routinely given at age 72 or 49. People who did not get Tdap at that age should get it as soon as possible. Tdap is especially important for health care professionals and anyone having close contact with a baby younger than 12 months. Pregnant women should get a dose of Tdap during every pregnancy, to protect the newborn from pertussis. Infants are most at risk for severe, life-threatening complications from pertussis. A similar vaccine, called Td, protects from tetanus and diphtheria, but not pertussis. A Td booster should be given every 10 years. Tdap may be given as one of these boosters if you have not already gotten a dose. Tdap may also be given after a severe cut or burn to prevent tetanus  infection. Your doctor can give you more information. Tdap may safely be given at the same time as other vaccines. 3. Some people should not get this vaccine  If you ever had a life-threatening allergic reaction after a dose of any tetanus, diphtheria, or pertussis containing vaccine, OR if you have a severe allergy to any part of this vaccine, you should not get Tdap. Tell your doctor if you have any severe allergies.  If you had a coma, or long or multiple seizures within 7 days after a childhood dose of DTP or DTaP, you should not get Tdap, unless a cause other than the vaccine was found. You can still get Td.  Talk to your doctor if you:  have epilepsy or another nervous system problem,  had severe pain or swelling after any vaccine containing diphtheria, tetanus or pertussis,  ever had Guillain-Barr Syndrome (GBS),  aren't feeling well on the day the shot is scheduled. 4. Risks of a vaccine reaction With any medicine, including vaccines, there is a chance of side effects. These are usually mild and go away on their own, but serious reactions are also possible. Brief fainting spells can follow a vaccination, leading to injuries from falling. Sitting or lying down for about 15 minutes can help prevent these. Tell  your doctor if you feel dizzy or light-headed, or have vision changes or ringing in the ears. Mild problems following Tdap (Did not interfere with activities)  Pain where the shot was given (about 3 in 4 adolescents or 2 in 3 adults)  Redness or swelling where the shot was given (about 1 person in 5)  Mild fever of at least 100.21F (up to about 1 in 25 adolescents or 1 in 100 adults)  Headache (about 3 or 4 people in 10)  Tiredness (about 1 person in 3 or 4)  Nausea, vomiting, diarrhea, stomach ache (up to 1 in 4 adolescents or 1 in 10 adults)  Chills, body aches, sore joints, rash, swollen glands (uncommon) Moderate problems following Tdap (Interfered with  activities, but did not require medical attention)  Pain where the shot was given (about 1 in 5 adolescents or 1 in 100 adults)  Redness or swelling where the shot was given (up to about 1 in 16 adolescents or 1 in 25 adults)  Fever over 102F (about 1 in 100 adolescents or 1 in 250 adults)  Headache (about 3 in 20 adolescents or 1 in 10 adults)  Nausea, vomiting, diarrhea, stomach ache (up to 1 or 3 people in 100)  Swelling of the entire arm where the shot was given (up to about 3 in 100). Severe problems following Tdap (Unable to perform usual activities; required medical attention)  Swelling, severe pain, bleeding and redness in the arm where the shot was given (rare). A severe allergic reaction could occur after any vaccine (estimated less than 1 in a million doses). 5. What if there is a serious reaction? What should I look for?  Look for anything that concerns you, such as signs of a severe allergic reaction, very high fever, or behavior changes. Signs of a severe allergic reaction can include hives, swelling of the face and throat, difficulty breathing, a fast heartbeat, dizziness, and weakness. These would start a few minutes to a few hours after the vaccination. What should I do?  If you think it is a severe allergic reaction or other emergency that can't wait, call 9-1-1 or get the person to the nearest hospital. Otherwise, call your doctor.  Afterward, the reaction should be reported to the "Vaccine Adverse Event Reporting System" (VAERS). Your doctor might file this report, or you can do it yourself through the VAERS web site at www.vaers.LAgents.no, or by calling 1-423 036 9562. VAERS is only for reporting reactions. They do not give medical advice.  6. The National Vaccine Injury Compensation Program The [employer] Vaccine Injury Compensation Program (VICP) is a federal program that was created to compensate people who may have been injured by certain vaccines. Persons who  believe they may have been injured by a vaccine can learn about the program and about filing a claim by calling 1-236 792 8139 or visiting the VICP website at SpiritualWord.at. 7. How can I learn more?  Ask your doctor.  Call your local or state health department.  Contact the Centers for Disease Control and Prevention (CDC):  Call 405 417 9000 or visit CDC's website at PicCapture.uy. CDC Tdap Vaccine VIS (10/16/11) Document Released: 11/25/2011 Document Revised: 10/10/2013 Document Reviewed: 09/07/2013 Exit[employer] Patient Information 2015 [place], [place]. This information is not intended to replace advice given to you by your health care provider. Make sure you discuss any questions you have with your health care provider.

## 2015-02-23 NOTE — Progress Notes (Signed)
Subjective:  Jillian Mcguire is a 34 y.o. [phone] at [redacted]w[redacted]d being seen today for ongoing prenatal care.  Patient reports fatigue and nightmares.  Contractions: Not present.  Vag. Bleeding: None. Movement: Present. Denies leaking of fluid.   The following portions of the patient's history were reviewed and updated as appropriate: allergies, current medications, past family history, past medical history, past social history, past surgical history and problem list.   Anatomy scan reviewed. Normal except for LL anterior placenta 1.9 cm from os.   Objective:   Filed Vitals:   02/23/15 0921  BP: 93/61  Pulse: 73  Weight: 128 lb (58.06 kg)    Fetal Status: Fetal Heart Rate (bpm): 155   Movement: Present     General:  Alert, oriented and cooperative. Patient is in no acute distress.  Skin: Skin is warm and dry. No rash noted.   Cardiovascular: Normal heart rate noted  Respiratory: Normal respiratory effort, no problems with respiration noted  Abdomen: Soft, gravid, appropriate for gestational age. Pain/Pressure: Present     Pelvic: Vag. Bleeding: None Vag D/C Character: Thin   Cervical exam deferred        Extremities: Normal range of motion.  Edema: None  Mental Status: Normal mood and affect. Normal behavior. Normal judgment and thought content.   Urinalysis: Urine Protein: Negative Urine Glucose: Negative  Assessment and Plan:  Pregnancy: [name] at [redacted]w[redacted]d  1. Supervision of normal pregnancy in first trimester   2. Fatigue Increase rest. Offered repeat CBC, declined Discussed dietary changes Sleep hygeine  3. Low lying placenta w/out hemorrhage  - F/U US at 30-32 weeks - Pelvic rest until documentation of resolutio - Bleeding precautions.    Preterm labor symptoms and general obstetric precautions including but not limited to vaginal bleeding, contractions, leaking of fluid and fetal movement were reviewed in detail with the patient. Please refer to After Visit Summary  for other counseling recommendations.  Return in about 8 weeks (around 04/20/2015).  Dorathy Kinsman, CNM

## 2015-02-25 DIAGNOSIS — O4442 Low lying placenta NOS or without hemorrhage, second trimester: Secondary | ICD-10-CM | POA: Insufficient documentation

## 2015-04-03 ENCOUNTER — Encounter: Payer: Self-pay | Admitting: *Deleted

## 2015-04-20 ENCOUNTER — Ambulatory Visit (INDEPENDENT_AMBULATORY_CARE_PROVIDER_SITE_OTHER): Payer: Medicaid Other | Admitting: Certified Nurse Midwife

## 2015-04-20 VITALS — BP 110/67 | HR 82 | Wt 139.0 lb

## 2015-04-20 DIAGNOSIS — Z3481 Encounter for supervision of other normal pregnancy, first trimester: Secondary | ICD-10-CM | POA: Diagnosis not present

## 2015-04-20 DIAGNOSIS — Z3491 Encounter for supervision of normal pregnancy, unspecified, first trimester: Secondary | ICD-10-CM

## 2015-04-20 LAB — CBC
HCT: 30.9 % — ABNORMAL LOW (ref 36.0–46.0)
Hemoglobin: 10.6 g/dL — ABNORMAL LOW (ref 12.0–15.0)
MCH: 31.7 pg (ref 26.0–34.0)
MCHC: 34.3 g/dL (ref 30.0–36.0)
MCV: 92.5 fL (ref 78.0–100.0)
MPV: 10.3 fL (ref 8.6–12.4)
Platelets: 165 10*3/uL (ref 150–400)
RBC: 3.34 MIL/uL — ABNORMAL LOW (ref 3.87–5.11)
RDW: 12.4 % (ref 11.5–15.5)
WBC: 4.9 10*3/uL (ref 4.0–10.5)

## 2015-04-20 LAB — VITAMIN B12: Vitamin B-12: 334 pg/mL (ref 211–911)

## 2015-04-20 NOTE — Progress Notes (Signed)
28wk labs today - declines TDAP and FLU

## 2015-04-20 NOTE — Progress Notes (Signed)
  Subjective:  Jillian Mcguire is a 34 y.o. [phone] at 3229w5d being seen today for ongoing prenatal care.  Patient reports no complaints.  Contractions: Irregular.  Vag. Bleeding: None. Movement: Present. Denies leaking of fluid.   The following portions of the patient's history were reviewed and updated as appropriate: allergies, current medications, past family history, past medical history, past social history, past surgical history and problem list. Problem list updated.  Objective:   Filed Vitals:   04/20/15 0922  BP: 110/67  Pulse: 82  Weight: 139 lb (63.05 kg)    Fetal Status:   Fundal Height: 30 cm Movement: Present     General:  Alert, oriented and cooperative. Patient is in no acute distress.  Skin: Skin is warm and dry. No rash noted.   Cardiovascular: Normal heart rate noted  Respiratory: Normal respiratory effort, no problems with respiration noted  Abdomen: Soft, gravid, appropriate for gestational age. Pain/Pressure: Present     Pelvic: Vag. Bleeding: None Vag D/C Character: Thin   Cervical exam deferred        Extremities: Normal range of motion.  Edema: None  Mental Status: Normal mood and affect. Normal behavior. Normal judgment and thought content.   Urinalysis: Urine Protein: Negative Urine Glucose: Negative  Assessment and Plan:  Pregnancy: [id number] at 3429w5d  1. Supervision of normal pregnancy in first trimester  - Glucose Tolerance, 1 HR (50g) - HIV antibody - RPR - CBC - US MFM OB FOLLOW UP; Future - Vitamin B12  Preterm labor symptoms and general obstetric precautions including but not limited to vaginal bleeding, contractions, leaking of fluid and fetal movement were reviewed in detail with the patient. Please refer to After Visit Summary for other counseling recommendations.  Return in about 2 weeks (around 05/04/2015) for schedule follow up ultrasound.   Rhea PinkLori A Terrius Gentile, CNM

## 2015-04-20 NOTE — Patient Instructions (Signed)
Glucose Tolerance Test The glucose tolerance test (GTT) is one of several tests used to diagnose diabetes mellitus. The GTT is a blood test, and it may include a urine test as well. The GTT checks to see how your body processes sugar (glucose). For this test, you will consume a drink containing a high level of glucose. Your blood glucose levels will be checked before you consume the drink and then again 1, 2, 3, and possibly 4 hours after you consume it. Your health care provider may recommend that you have the GTT if you:  Have a family history of diabetes.   Are very overweight (obese).   Have experienced infections that keep coming back.   Have had numerous cuts or wounds that did not heal quickly, especially on your legs and feet.   Are a woman and have a history of giving birth to very large babies or a history of repeated fetal loss (stillbirth).  Have had glucose in your urine or high blood sugar:   During pregnancy.   After a heart attack, surgery, or prolonged periods of high stress.  The GTT lasts 3-4 hours. Other than the glucose solution, you will not be allowed to eat or drink anything during the test. You must remain at the testing location to make sure that your blood and urine samples are taken on time. PREPARATION FOR TEST Eat normally for 3 days prior to the GTT test, including having plenty of carbohydrate-rich foods. Do not eat or drink anything except water during the final 12 hours before the test. You should not smoke or exercise during the test. In addition, your health care provider may ask you to stop taking certain medicines before the test. RESULTS It is your responsibility to obtain your test results. Ask the lab or department performing the test when and how you will get your results. Contact your health care provider to discuss any questions you have about your results. Range of Normal Values Ranges for normal values may vary among different labs and  hospitals. You should always check with your health care provider after having lab work or other tests done to discuss whether your values are considered within normal limits.  Normal levels of blood glucose are as follows:  Fasting: less than 110 mg/dL or less than 6.1 mmol/L (SI units).  1 hour after consuming the glucose drink: less than 200 mg/dL or less than 11.1 mmol/L.  2 hours after consuming the glucose drink: less than 140 mg/dL or less than 7.8 mmol/L.  3 hours after consuming the glucose drink: 70-115 mg/dL or less than 6.4 mmol/L.  4 hours after consuming the glucose drink: 70-115 mg/dL or less than 6.4 mmol/L. The normal result for the urine test is negative, meaning that glucose is absent from your urine. Some substances can interfere with GTT results. These may include:  Blood pressure and heart failure medicines, including beta blockers, furosemide, and thiazides.   Anti-inflammatory medicines, including aspirin.   Nicotine.   Some psychiatric medicines.   Oral contraceptives.   Diuretics or corticosteroids. Meaning of Results Outside Normal Value Ranges GTT test results that are above normal values may indicate health problems, such as:  Diabetes mellitus.   Acute stress response.   Cushing syndrome.   Tumors such as pheochromocytoma or glucagonoma.   Chronic renal failure.   Pancreatitis.   Hyperthyroidism.   Current infection.  Discuss your test results with your health care provider. He or she will use the results   to make a diagnosis and determine a treatment plan that is right for you.   This information is not intended to replace advice given to you by your health care provider. Make sure you discuss any questions you have with your health care provider.   Document Released: 06/18/2004 Document Revised: 06/16/2014 Document Reviewed: 09/30/2013 Elsevier Interactive Patient Education 2016 Elsevier Inc.  

## 2015-04-21 LAB — HIV ANTIBODY (ROUTINE TESTING W REFLEX): HIV 1&2 Ab, 4th Generation: NONREACTIVE

## 2015-04-21 LAB — RPR

## 2015-04-21 LAB — GLUCOSE TOLERANCE, 1 HOUR (50G) W/O FASTING: Glucose, 1 Hour GTT: 132 mg/dL (ref 70–140)

## 2015-04-24 ENCOUNTER — Telehealth: Payer: Self-pay | Admitting: *Deleted

## 2015-04-24 NOTE — Telephone Encounter (Signed)
Called pt to adv normal 1HrGTT, start Iron - HgB 10.6, and B12 normal at 334. Pt expressed understanding.

## 2015-04-27 ENCOUNTER — Ambulatory Visit (HOSPITAL_COMMUNITY): Payer: Medicaid Other

## 2015-04-30 ENCOUNTER — Other Ambulatory Visit: Payer: Self-pay | Admitting: Certified Nurse Midwife

## 2015-04-30 ENCOUNTER — Ambulatory Visit (HOSPITAL_COMMUNITY)
Admission: RE | Admit: 2015-04-30 | Discharge: 2015-04-30 | Disposition: A | Discharge status: Home / Self Care | Payer: Medicaid Other | Source: Ambulatory Visit | Attending: Certified Nurse Midwife | Admitting: Certified Nurse Midwife

## 2015-04-30 DIAGNOSIS — O09213 Supervision of pregnancy with history of pre-term labor, third trimester: Secondary | ICD-10-CM | POA: Diagnosis not present

## 2015-04-30 DIAGNOSIS — Z36 Encounter for antenatal screening of mother: Secondary | ICD-10-CM | POA: Insufficient documentation

## 2015-04-30 DIAGNOSIS — O34219 Maternal care for unspecified type scar from previous cesarean delivery: Secondary | ICD-10-CM | POA: Diagnosis not present

## 2015-04-30 DIAGNOSIS — IMO0002 Reserved for concepts with insufficient information to code with codable children: Secondary | ICD-10-CM

## 2015-04-30 DIAGNOSIS — O4403 Placenta previa specified as without hemorrhage, third trimester: Secondary | ICD-10-CM

## 2015-04-30 DIAGNOSIS — O4413 Placenta previa with hemorrhage, third trimester: Secondary | ICD-10-CM | POA: Diagnosis present

## 2015-04-30 DIAGNOSIS — Z3A29 29 weeks gestation of pregnancy: Secondary | ICD-10-CM

## 2015-04-30 DIAGNOSIS — Z8742 Personal history of other diseases of the female genital tract: Secondary | ICD-10-CM

## 2015-04-30 DIAGNOSIS — O09893 Supervision of other high risk pregnancies, third trimester: Secondary | ICD-10-CM

## 2015-04-30 DIAGNOSIS — Z87798 Personal history of other (corrected) congenital malformations: Secondary | ICD-10-CM

## 2015-04-30 DIAGNOSIS — Z0489 Encounter for examination and observation for other specified reasons: Secondary | ICD-10-CM

## 2015-05-02 ENCOUNTER — Telehealth: Payer: Self-pay | Admitting: *Deleted

## 2015-05-02 NOTE — Telephone Encounter (Signed)
Pt called in stating she has had pain in vagina that feels like contractions. States pain is not severe nor regular. She has not had any bleeding, change in vaginal discharge, and baby is moving well. Adv pt to rest and hydrate. If pain continues/gets worse or if she begins bleeding, or any other signs of labor to report to [facility] for eval. Pt expressed understanding.

## 2015-05-18 ENCOUNTER — Ambulatory Visit (INDEPENDENT_AMBULATORY_CARE_PROVIDER_SITE_OTHER): Payer: Medicaid Other | Admitting: Advanced Practice Midwife

## 2015-05-18 VITALS — BP 112/72 | HR 110 | Wt 142.0 lb

## 2015-05-18 DIAGNOSIS — Z3481 Encounter for supervision of other normal pregnancy, first trimester: Secondary | ICD-10-CM

## 2015-05-18 DIAGNOSIS — Z3491 Encounter for supervision of normal pregnancy, unspecified, first trimester: Secondary | ICD-10-CM

## 2015-05-18 DIAGNOSIS — O479 False labor, unspecified: Secondary | ICD-10-CM

## 2015-05-18 NOTE — Progress Notes (Signed)
Would like a cervical check.

## 2015-05-18 NOTE — Progress Notes (Signed)
Subjective:  Jillian Mcguire is a 34 y.o. [id number] at 3540w5d being seen today for ongoing prenatal care.  She is currently monitored for the following issues for this low-risk pregnancy and has Previous cesarean delivery affecting pregnancy, antepartum; PCO (polycystic ovaries); Supervision of normal pregnancy in first trimester; History of preterm delivery, currently pregnant in first trimester; and Low-lying placenta in second trimester on her problem list.  Patient reports contractions since 2-3 days ago, intermittent, worsening with activity.  Contractions: Not present. Vag. Bleeding: None.  Movement: Present. Denies leaking of fluid.   The following portions of the patient's history were reviewed and updated as appropriate: allergies, current medications, past family history, past medical history, past social history, past surgical history and problem list. Problem list updated.  Objective:   Filed Vitals:   05/18/15 0915  BP: 112/72  Pulse: 110  Weight: 142 lb (64.411 kg)    Fetal Status: Fetal Heart Rate (bpm): 144 Fundal Height: 32 cm Movement: Present     General:  Alert, oriented and cooperative. Patient is in no acute distress.  Skin: Skin is warm and dry. No rash noted.   Cardiovascular: Normal heart rate noted  Respiratory: Normal respiratory effort, no problems with respiration noted  Abdomen: Soft, gravid, appropriate for gestational age. Pain/Pressure: Present     Pelvic: Vag. Bleeding: None Vag D/C Character: Thin   Cervical exam performed Dilation: Closed Effacement (%): Thick    Extremities: Normal range of motion.  Edema: None  Mental Status: Normal mood and affect. Normal behavior. Normal judgment and thought content.   Urinalysis: Urine Protein: Negative Urine Glucose: Negative  Assessment and Plan:  Pregnancy: [id number] at 1740w5d  1. Supervision of normal pregnancy in first trimester  2. Braxton Hicks contractions --Cervix closed today, so no signs of labor.  Reassurance provided to pt.  PTL precautions given.    Preterm labor symptoms and general obstetric precautions including but not limited to vaginal bleeding, contractions, leaking of fluid and fetal movement were reviewed in detail with the patient. Please refer to After Visit Summary for other counseling recommendations.  No Follow-up on file.   Hurshel PartyLisa A Leftwich-Kirby, CNM

## 2015-06-10 NOTE — L&D Delivery Note (Signed)
Shoulder Dystocia Delivery Note Pushed x 14 minutes. At 4:28 AM a viable female was delivered via Vaginal, Spontaneous Delivery, VBAC.  Presentation: vertex; Position: Right,, Occiput,, Anterior. Spontaneous cry at delivery x 1, then poor color, no further breathing x ~1 minute. Cord clamped and cut quickly (infant started crying) and infant handed over to NICU team. Moving arms well.   Delivery of the head: 07/18/2015  4:27 AM First maneuver: 07/18/2015  4:27 AM attempted delivery of anterior shoulder, McRoberts Second maneuver: 07/18/2015  4:28 AM, Posterior (right) axilla hooked, rotated from 5 o'clock to 7 o'clock, posterior arm delivered Third maneuver: ,   Fourth maneuver: ,   Fifth maneuver: ,   Sixth maneuver: ,    APGAR: 7, 8; weight 10 lb 2.4 oz (4605 g).   Placenta status: Intact, Spontaneous.   Cord: 3 vessels with the following complications: None.  Cord pH: NA  Anesthesia: Epidural  Episiotomy: None Lacerations: 2nd degree Suture Repair: 3.0 vicryl rapide Est. Blood Loss (mL): 250  Mom to postpartum.  Baby to Couplet care / Skin to Skin.  Jillian Mcguire 07/18/2015, 7:42 AM

## 2015-06-15 ENCOUNTER — Ambulatory Visit (INDEPENDENT_AMBULATORY_CARE_PROVIDER_SITE_OTHER): Payer: Medicaid Other | Admitting: Advanced Practice Midwife

## 2015-06-15 VITALS — BP 117/69 | HR 92 | Wt 142.0 lb

## 2015-06-15 DIAGNOSIS — Z3481 Encounter for supervision of other normal pregnancy, first trimester: Secondary | ICD-10-CM

## 2015-06-15 DIAGNOSIS — Z36 Encounter for antenatal screening of mother: Secondary | ICD-10-CM | POA: Diagnosis not present

## 2015-06-15 DIAGNOSIS — Z3491 Encounter for supervision of normal pregnancy, unspecified, first trimester: Secondary | ICD-10-CM

## 2015-06-15 LAB — OB RESULTS CONSOLE GBS: GBS: NEGATIVE

## 2015-06-16 LAB — GC/CHLAMYDIA PROBE AMP
CT Probe RNA: NOT DETECTED
GC Probe RNA: NOT DETECTED

## 2015-06-17 NOTE — Patient Instructions (Signed)
Braxton Hicks Contractions °Contractions of the uterus can occur throughout pregnancy. Contractions are not always a sign that you are in labor.  °WHAT ARE BRAXTON HICKS CONTRACTIONS?  °Contractions that occur before labor are called Braxton Hicks contractions, or false labor. Toward the end of pregnancy (32-34 weeks), these contractions can develop more often and may become more forceful. This is not true labor because these contractions do not result in opening (dilatation) and thinning of the cervix. They are sometimes difficult to tell apart from true labor because these contractions can be forceful and people have different pain tolerances. You should not feel embarrassed if you go to the hospital with false labor. Sometimes, the only way to tell if you are in true labor is for your health care provider to look for changes in the cervix. °If there are no prenatal problems or other health problems associated with the pregnancy, it is completely safe to be sent home with false labor and await the onset of true labor. °HOW CAN YOU TELL THE DIFFERENCE BETWEEN TRUE AND FALSE LABOR? °False Labor °· The contractions of false labor are usually shorter and not as hard as those of true labor.   °· The contractions are usually irregular.   °· The contractions are often felt in the front of the lower abdomen and in the groin.   °· The contractions may go away when you walk around or change positions while lying down.   °· The contractions get weaker and are shorter lasting as time goes on.   °· The contractions do not usually become progressively stronger, regular, and closer together as with true labor.   °True Labor °· Contractions in true labor last 30-70 seconds, become very regular, usually become more intense, and increase in frequency.   °· The contractions do not go away with walking.   °· The discomfort is usually felt in the top of the uterus and spreads to the lower abdomen and low back.   °· True labor can be  determined by your health care provider with an exam. This will show that the cervix is dilating and getting thinner.   °WHAT TO REMEMBER °· Keep up with your usual exercises and follow other instructions given by your health care provider.   °· Take medicines as directed by your health care provider.   °· Keep your regular prenatal appointments.   °· Eat and drink lightly if you think you are going into labor.   °· If Braxton Hicks contractions are making you uncomfortable:   °¨ Change your position from lying down or resting to walking, or from walking to resting.   °¨ Sit and rest in a tub of warm water.   °¨ Drink 2-3 glasses of water. Dehydration may cause these contractions.   °¨ Do slow and deep breathing several times an hour.   °WHEN SHOULD I SEEK IMMEDIATE MEDICAL CARE? °Seek immediate medical care if: °· Your contractions become stronger, more regular, and closer together.   °· You have fluid leaking or gushing from your vagina.   °· You have a fever.   °· You pass blood-tinged mucus.   °· You have vaginal bleeding.   °· You have continuous abdominal pain.   °· You have low back pain that you never had before.   °· You feel your baby's head pushing down and causing pelvic pressure.   °· Your baby is not moving as much as it used to.   °  °This information is not intended to replace advice given to you by your health care provider. Make sure you discuss any questions you have with your health care   provider. °  °Document Released: 05/26/2005 Document Revised: 05/31/2013 Document Reviewed: 03/07/2013 °Elsevier Interactive Patient Education ©2016 Elsevier Inc. ° °

## 2015-06-17 NOTE — Progress Notes (Signed)
Subjective:  Jillian Mcguire is a 35 y.o. [id number] at 5517w5d being seen today for ongoing prenatal care.  She is currently monitored for the following issues for this low-risk pregnancy and has Previous cesarean delivery affecting pregnancy, antepartum; PCO (polycystic ovaries); Supervision of normal pregnancy in first trimester; History of preterm delivery, currently pregnant in first trimester; and Low-lying placenta in second trimester on her problem list.  Patient reports occasional contractions.  Contractions: Irregular. Vag. Bleeding: None.  Movement: Present. Denies leaking of fluid.   The following portions of the patient's history were reviewed and updated as appropriate: allergies, current medications, past family history, past medical history, past social history, past surgical history and problem list. Problem list updated.  Objective:   Filed Vitals:   06/15/15 0922  BP: 117/69  Pulse: 92  Weight: 142 lb (64.411 kg)    Fetal Status: Fetal Heart Rate (bpm): 130   Movement: Present     General:  Alert, oriented and cooperative. Patient is in no acute distress.  Skin: Skin is warm and dry. No rash noted.   Cardiovascular: Normal heart rate noted  Respiratory: Normal respiratory effort, no problems with respiration noted  Abdomen: Soft, gravid, appropriate for gestational age. Pain/Pressure: Present     Pelvic: Vag. Bleeding: None Vag D/C Character: Thin   Cervical exam performed      0/0/-3  Extremities: Normal range of motion.     Mental Status: Normal mood and affect. Normal behavior. Normal judgment and thought content.   Urinalysis: Urine Protein: Negative Urine Glucose: Negative  Assessment and Plan:  Pregnancy: [id number] at 4917w5d  1. Supervision of normal pregnancy in first trimester  - GC/Chlamydia Probe Amp - Culture, beta strep (group b only)  Preterm labor symptoms and general obstetric precautions including but not limited to vaginal bleeding, contractions,  leaking of fluid and fetal movement were reviewed in detail with the patient. Please refer to After Visit Summary for other counseling recommendations.  Follow-up in one week   Dorathy KinsmanVirginia Naydeen Speirs, [place]

## 2015-06-20 LAB — CULTURE, BETA STREP (GROUP B ONLY)

## 2015-06-21 ENCOUNTER — Ambulatory Visit (INDEPENDENT_AMBULATORY_CARE_PROVIDER_SITE_OTHER): Payer: Medicaid Other | Admitting: Obstetrics & Gynecology

## 2015-06-21 VITALS — BP 115/74 | HR 102 | Wt 147.0 lb

## 2015-06-21 DIAGNOSIS — O2243 Hemorrhoids in pregnancy, third trimester: Secondary | ICD-10-CM

## 2015-06-21 DIAGNOSIS — Z3491 Encounter for supervision of normal pregnancy, unspecified, first trimester: Secondary | ICD-10-CM

## 2015-06-21 DIAGNOSIS — O34219 Maternal care for unspecified type scar from previous cesarean delivery: Secondary | ICD-10-CM

## 2015-06-21 DIAGNOSIS — Z3481 Encounter for supervision of other normal pregnancy, first trimester: Secondary | ICD-10-CM

## 2015-06-21 DIAGNOSIS — Z3A36 36 weeks gestation of pregnancy: Secondary | ICD-10-CM

## 2015-06-21 MED ORDER — HYDROCORTISONE 2.5 % RE CREA: 1.0000 "application " | TOPICAL_CREAM | Freq: Two times a day (BID) | RECTAL | Status: DC

## 2015-06-21 NOTE — Progress Notes (Signed)
Subjective:  Jillian Mcguire is a 35 y.o. [id number] at 6023w4d being seen today for ongoing prenatal care.  She is currently monitored for the following issues for this low-risk pregnancy and has Previous cesarean delivery affecting pregnancy, antepartum; PCO (polycystic ovaries); Supervision of normal pregnancy in first trimester; History of preterm delivery, currently pregnant in first trimester; and Low-lying placenta in second trimester on her problem list.  Patient reports no complaints.  Contractions: Irregular. Vag. Bleeding: None.  Movement: Present. Denies leaking of fluid.   The following portions of the patient's history were reviewed and updated as appropriate: allergies, current medications, past family history, past medical history, past social history, past surgical history and problem list. Problem list updated.  Objective:   Filed Vitals:   06/21/15 1417  BP: 115/74  Pulse: 102  Weight: 147 lb (66.679 kg)    Fetal Status: Fetal Heart Rate (bpm): 131   Movement: Present     General:  Alert, oriented and cooperative. Patient is in no acute distress.  Skin: Skin is warm and dry. No rash noted.   Cardiovascular: Normal heart rate noted  Respiratory: Normal respiratory effort, no problems with respiration noted  Abdomen: Soft, gravid, appropriate for gestational age. Pain/Pressure: Present     Pelvic: Vag. Bleeding: None Vag D/C Character: Thin   Cervical exam performed        Extremities: Normal range of motion.  Edema: None  Mental Status: Normal mood and affect. Normal behavior. Normal judgment and thought content.   Urinalysis: Urine Protein: Negative Urine Glucose: Negative  Assessment and Plan:  Pregnancy: [id number] at 4623w4d  1. Previous cesarean delivery affecting pregnancy, antepartum - She would like to have a TOLAC  2. Supervision of normal pregnancy in first trimester   Term labor symptoms and general obstetric precautions including but not limited to  vaginal bleeding, contractions, leaking of fluid and fetal movement were reviewed in detail with the patient. Please refer to After Visit Summary for other counseling recommendations.  Return in about 1 week (around 06/28/2015).   Allie BossierMyra C Dafne Nield, MD

## 2015-06-21 NOTE — Addendum Note (Signed)
Addended by: Allie BossierVE, Olimpia Tinch C on: 06/21/2015 03:59 PM   Modules accepted: Orders

## 2015-06-21 NOTE — Progress Notes (Signed)
Severe hemorrhoids      Pt is non-compliant with babyscripts

## 2015-06-29 ENCOUNTER — Ambulatory Visit (INDEPENDENT_AMBULATORY_CARE_PROVIDER_SITE_OTHER): Payer: Medicaid Other | Admitting: Family

## 2015-06-29 ENCOUNTER — Encounter: Payer: Self-pay | Admitting: *Deleted

## 2015-06-29 VITALS — BP 116/70 | HR 93 | Wt 150.0 lb

## 2015-06-29 DIAGNOSIS — Z3483 Encounter for supervision of other normal pregnancy, third trimester: Secondary | ICD-10-CM

## 2015-06-29 DIAGNOSIS — O34219 Maternal care for unspecified type scar from previous cesarean delivery: Secondary | ICD-10-CM

## 2015-06-29 DIAGNOSIS — Z3493 Encounter for supervision of normal pregnancy, unspecified, third trimester: Secondary | ICD-10-CM

## 2015-06-29 DIAGNOSIS — Z3491 Encounter for supervision of normal pregnancy, unspecified, first trimester: Secondary | ICD-10-CM

## 2015-06-29 NOTE — Progress Notes (Signed)
Subjective:  Jillian Mcguire is a 35 y.o. [id number] at [redacted]w[redacted]d being seen today for ongoing prenatal care.  She is currently monitored for the following issues for this high-risk pregnancy and has Previous cesarean delivery affecting pregnancy, antepartum; PCO (polycystic ovaries);  History of preterm delivery, currently pregnant in first trimester; and Low-lying placenta in second trimester on her problem list now resolved.  Patient reports occasional contractions.  Contractions: Irregular. Vag. Bleeding: None.  Movement: Present. Denies leaking of fluid.   The following portions of the patient's history were reviewed and updated as appropriate: allergies, current medications, past family history, past medical history, past social history, past surgical history and problem list. Problem list updated.  Objective:   Filed Vitals:   06/29/15 0930  BP: 116/70  Pulse: 93  Weight: 150 lb (68.04 kg)    Fetal Status: Fetal Heart Rate (bpm): 136   Movement: Present     General:  Alert, oriented and cooperative. Patient is in no acute distress.  Skin: Skin is warm and dry. No rash noted.   Cardiovascular: Normal heart rate noted  Respiratory: Normal respiratory effort, no problems with respiration noted  Abdomen: Soft, gravid, appropriate for gestational age. Pain/Pressure: Present     Pelvic: Vag. Bleeding: None Vag D/C Character: Thin   Cervical exam performed      1.5/50/-3  Extremities: Normal range of motion.  Edema: None  Mental Status: Normal mood and affect. Normal behavior. Normal judgment and thought content.   Urinalysis: Urine Protein: Negative Urine Glucose: Negative  Assessment and Plan:  Pregnancy: [id number] at [redacted]w[redacted]d  TOLAC consent signed.  There are no diagnoses linked to this encounter. Term labor symptoms and general obstetric precautions including but not limited to vaginal bleeding, contractions, leaking of fluid and fetal movement were reviewed in detail with the  patient. Please refer to After Visit Summary for other counseling recommendations.  Return in about 1 week (around 07/06/2015).   Jillian Mcguire Jillian Mcguire, CNM

## 2015-07-06 ENCOUNTER — Ambulatory Visit (INDEPENDENT_AMBULATORY_CARE_PROVIDER_SITE_OTHER): Payer: Medicaid Other | Admitting: Advanced Practice Midwife

## 2015-07-06 VITALS — BP 126/74 | HR 84 | Wt 151.0 lb

## 2015-07-06 DIAGNOSIS — Z3491 Encounter for supervision of normal pregnancy, unspecified, first trimester: Secondary | ICD-10-CM

## 2015-07-06 DIAGNOSIS — Z3481 Encounter for supervision of other normal pregnancy, first trimester: Secondary | ICD-10-CM

## 2015-07-06 DIAGNOSIS — O34219 Maternal care for unspecified type scar from previous cesarean delivery: Secondary | ICD-10-CM

## 2015-07-06 NOTE — Patient Instructions (Signed)
Fetal Movement Counts  Patient Name: __________________________________________________ Patient Due Date: ____________________  Performing a fetal movement count is highly recommended in high-risk pregnancies, but it is good for every pregnant woman to do. Your health care provider may ask you to start counting fetal movements at 28 weeks of the pregnancy. Fetal movements often increase:  · After eating a full meal.  · After physical activity.  · After eating or drinking something sweet or cold.  · At rest.  Pay attention to when you feel the baby is most active. This will help you notice a pattern of your baby's sleep and wake cycles and what factors contribute to an increase in fetal movement. It is important to perform a fetal movement count at the same time each day when your baby is normally most active.   HOW TO COUNT FETAL MOVEMENTS  1. Find a quiet and comfortable area to sit or lie down on your left side. Lying on your left side provides the best blood and oxygen circulation to your baby.  2. Write down the day and time on a sheet of paper or in a journal.  3. Start counting kicks, flutters, swishes, rolls, or jabs in a 2-hour period. You should feel at least 10 movements within 2 hours.  4. If you do not feel 10 movements in 2 hours, wait 2-3 hours and count again. Look for a change in the pattern or not enough counts in 2 hours.  SEEK MEDICAL CARE IF:  · You feel less than 10 counts in 2 hours, tried twice.  · There is no movement in over an hour.  · The pattern is changing or taking longer each day to reach 10 counts in 2 hours.  · You feel the baby is not moving as he or she usually does.  Date: ____________ Movements: ____________ Start time: ____________ Finish time: ____________   Date: ____________ Movements: ____________ Start time: ____________ Finish time: ____________  Date: ____________ Movements: ____________ Start time: ____________ Finish time: ____________  Date: ____________ Movements:  ____________ Start time: ____________ Finish time: ____________  Date: ____________ Movements: ____________ Start time: ____________ Finish time: ____________  Date: ____________ Movements: ____________ Start time: ____________ Finish time: ____________  Date: ____________ Movements: ____________ Start time: ____________ Finish time: ____________  Date: ____________ Movements: ____________ Start time: ____________ Finish time: ____________   Date: ____________ Movements: ____________ Start time: ____________ Finish time: ____________  Date: ____________ Movements: ____________ Start time: ____________ Finish time: ____________  Date: ____________ Movements: ____________ Start time: ____________ Finish time: ____________  Date: ____________ Movements: ____________ Start time: ____________ Finish time: ____________  Date: ____________ Movements: ____________ Start time: ____________ Finish time: ____________  Date: ____________ Movements: ____________ Start time: ____________ Finish time: ____________  Date: ____________ Movements: ____________ Start time: ____________ Finish time: ____________   Date: ____________ Movements: ____________ Start time: ____________ Finish time: ____________  Date: ____________ Movements: ____________ Start time: ____________ Finish time: ____________  Date: ____________ Movements: ____________ Start time: ____________ Finish time: ____________  Date: ____________ Movements: ____________ Start time: ____________ Finish time: ____________  Date: ____________ Movements: ____________ Start time: ____________ Finish time: ____________  Date: ____________ Movements: ____________ Start time: ____________ Finish time: ____________  Date: ____________ Movements: ____________ Start time: ____________ Finish time: ____________   Date: ____________ Movements: ____________ Start time: ____________ Finish time: ____________  Date: ____________ Movements: ____________ Start time: ____________ Finish  time: ____________  Date: ____________ Movements: ____________ Start time: ____________ Finish time: ____________  Date: ____________ Movements: ____________ Start time:   ____________ Finish time: ____________  Date: ____________ Movements: ____________ Start time: ____________ Finish time: ____________  Date: ____________ Movements: ____________ Start time: ____________ Finish time: ____________  Date: ____________ Movements: ____________ Start time: ____________ Finish time: ____________   Date: ____________ Movements: ____________ Start time: ____________ Finish time: ____________  Date: ____________ Movements: ____________ Start time: ____________ Finish time: ____________  Date: ____________ Movements: ____________ Start time: ____________ Finish time: ____________  Date: ____________ Movements: ____________ Start time: ____________ Finish time: ____________  Date: ____________ Movements: ____________ Start time: ____________ Finish time: ____________  Date: ____________ Movements: ____________ Start time: ____________ Finish time: ____________  Date: ____________ Movements: ____________ Start time: ____________ Finish time: ____________   Date: ____________ Movements: ____________ Start time: ____________ Finish time: ____________  Date: ____________ Movements: ____________ Start time: ____________ Finish time: ____________  Date: ____________ Movements: ____________ Start time: ____________ Finish time: ____________  Date: ____________ Movements: ____________ Start time: ____________ Finish time: ____________  Date: ____________ Movements: ____________ Start time: ____________ Finish time: ____________  Date: ____________ Movements: ____________ Start time: ____________ Finish time: ____________  Date: ____________ Movements: ____________ Start time: ____________ Finish time: ____________   Date: ____________ Movements: ____________ Start time: ____________ Finish time: ____________  Date: ____________  Movements: ____________ Start time: ____________ Finish time: ____________  Date: ____________ Movements: ____________ Start time: ____________ Finish time: ____________  Date: ____________ Movements: ____________ Start time: ____________ Finish time: ____________  Date: ____________ Movements: ____________ Start time: ____________ Finish time: ____________  Date: ____________ Movements: ____________ Start time: ____________ Finish time: ____________  Date: ____________ Movements: ____________ Start time: ____________ Finish time: ____________   Date: ____________ Movements: ____________ Start time: ____________ Finish time: ____________  Date: ____________ Movements: ____________ Start time: ____________ Finish time: ____________  Date: ____________ Movements: ____________ Start time: ____________ Finish time: ____________  Date: ____________ Movements: ____________ Start time: ____________ Finish time: ____________  Date: ____________ Movements: ____________ Start time: ____________ Finish time: ____________  Date: ____________ Movements: ____________ Start time: ____________ Finish time: ____________     This information is not intended to replace advice given to you by your health care provider. Make sure you discuss any questions you have with your health care provider.     Document Released: 06/25/2006 Document Revised: 06/16/2014 Document Reviewed: 03/22/2012  Elsevier Interactive Patient Education ©2016 [employer]

## 2015-07-06 NOTE — Progress Notes (Signed)
Subjective:  Jillian Mcguire is a 35 y.o. [id number] at [redacted]w[redacted]d being seen today for ongoing prenatal care.  She is currently monitored for the following issues for this high-risk pregnancy and has Previous cesarean delivery affecting pregnancy, antepartum; PCO (polycystic ovaries); Supervision of normal pregnancy in first trimester; History of preterm delivery, currently pregnant in first trimester; and Low-lying placenta in second trimester on her problem list.  Patient reports occasional contractions.  Contractions: Irritability. Vag. Bleeding: None.  Movement: Present. Denies leaking of fluid.   The following portions of the patient's history were reviewed and updated as appropriate: allergies, current medications, past family history, past medical history, past social history, past surgical history and problem list. Problem list updated.  Objective:   Filed Vitals:   07/06/15 0914  BP: 126/74  Pulse: 84  Weight: 151 lb (68.493 kg)    Fetal Status: Fetal Heart Rate (bpm): 143 Fundal Height: 39 cm Movement: Present  Presentation: Vertex  General:  Alert, oriented and cooperative. Patient is in no acute distress.  Skin: Skin is warm and dry. No rash noted.   Cardiovascular: Normal heart rate noted  Respiratory: Normal respiratory effort, no problems with respiration noted  Abdomen: Soft, gravid, appropriate for gestational age. Pain/Pressure: Present     Pelvic: Vag. Bleeding: None Vag D/C Character: Thin   Cervical exam performed Dilation: 2 Effacement (%): 50 Station: -3  Extremities: Normal range of motion.  Edema: None  Mental Status: Normal mood and affect. Normal behavior. Normal judgment and thought content.   Urinalysis: Urine Protein: Negative Urine Glucose: Negative  Assessment and Plan:  Pregnancy: [name] at [redacted]w[redacted]d  1. Previous cesarean delivery affecting pregnancy, antepartum   2. Supervision of normal pregnancy in first trimester   Term labor symptoms and general  obstetric precautions including but not limited to vaginal bleeding, contractions, leaking of fluid and fetal movement were reviewed in detail with the patient. Please refer to After Visit Summary for other counseling recommendations.  Lengthy discussion about timing of delivery.  Return in about 1 week (around 07/13/2015).   Dorathy Kinsman, CNM

## 2015-07-09 ENCOUNTER — Ambulatory Visit (INDEPENDENT_AMBULATORY_CARE_PROVIDER_SITE_OTHER): Payer: Medicaid Other | Admitting: Advanced Practice Midwife

## 2015-07-09 VITALS — BP 116/70 | HR 78 | Wt 153.0 lb

## 2015-07-09 DIAGNOSIS — Z3481 Encounter for supervision of other normal pregnancy, first trimester: Secondary | ICD-10-CM

## 2015-07-09 DIAGNOSIS — Z3491 Encounter for supervision of normal pregnancy, unspecified, first trimester: Secondary | ICD-10-CM

## 2015-07-09 DIAGNOSIS — O34219 Maternal care for unspecified type scar from previous cesarean delivery: Secondary | ICD-10-CM

## 2015-07-10 NOTE — Patient Instructions (Signed)
Vaginal Birth After Cesarean Delivery  Vaginal birth after cesarean delivery (VBAC) is giving birth vaginally after previously delivering a baby by a cesarean. In the past, if a woman had a cesarean delivery, all births afterward would be done by cesarean delivery. This is no longer true. It can be safe for the mother to try a vaginal delivery after having a cesarean delivery.   It is important to discuss VBAC with your health care provider early in the pregnancy so you can understand the risks, benefits, and options. It will give you time to decide what is best in your particular case. The final decision about whether to have a VBAC or repeat cesarean delivery should be between you and your health care provider. Any changes in your health or your baby's health during your pregnancy may make it necessary to change your initial decision about VBAC.   WOMEN WHO PLAN TO HAVE A VBAC SHOULD CHECK WITH THEIR HEALTH CARE PROVIDER TO BE SURE THAT:  · The previous cesarean delivery was done with a low transverse uterine cut (incision) (not a vertical classical incision).    · The birth canal is big enough for the baby.    · There were no other operations on the uterus.    · An electronic fetal monitor (EFM) will be on at all times during labor.    · An operating room will be available and ready in case an emergency cesarean delivery is needed.    · A health care provider and surgical nursing staff will be available at all times during labor to be ready to do an emergency delivery cesarean if necessary.    · An anesthesiologist will be present in case an emergency cesarean delivery is needed.    · The nursery is prepared and has adequate personnel and necessary equipment available to care for the baby in case of an emergency cesarean delivery.  BENEFITS OF VBAC  · Shorter stay in the hospital.    · Avoidance of risks associated with cesarean delivery, such as:    Surgical complications, such as opening of the incision or  hernia in the incision.    Injury to other organs.    Fever. This can occur if an infection develops after surgery. It can also occur as a reaction to the medicine given to make you numb during the surgery.  · Less blood loss and need for blood transfusions.  · Lower risk of blood clots and infection.   · Shorter recovery.    · Decreased risk for having to remove the uterus (hysterectomy).    · Decreased risk for the placenta to completely or partially cover the opening of the uterus (placenta previa) with a future pregnancy.    · Decrease risk in future labor and delivery.  RISKS OF A VBAC  · Tearing (rupture) of the uterus. This is occurs in less than 1% of VBACs. The risk of this happening is higher if:    Steps are taken to begin the labor process (induce labor) or stimulate or strengthen contractions (augment labor).      Medicine is used to soften (ripen) the cervix.  · Having to remove the uterus (hysterectomy) if it ruptures.  VBAC SHOULD NOT BE DONE IF:  · The previous cesarean delivery was done with a vertical (classical) or T-shaped incision or you do not know what kind of incision was made.    · You had a ruptured uterus.    · You have had certain types of surgery on your uterus, such as removal of uterine fibroids.   Ask your health care provider about other types of surgeries that prevent you from having a VBAC.  · You have certain medical or childbirth (obstetrical) problems.    · There are problems with the baby.    · You have had two previous cesarean deliveries and no vaginal deliveries.  OTHER FACTS TO KNOW ABOUT VBAC:  · It is safe to have an epidural anesthetic with VBAC.    · It is safe to turn the baby from a breech position (attempt an external cephalic version).    · It is safe to try a VBAC with twins.    · VBAC may not be successful if your baby weights 8.8 lb (4 kg) or more. However, weight predictions are not always accurate and should not be used alone to decide if VBAC is right for  you.  · There is an increased failure rate if the time between the cesarean delivery and VBAC is less than 19 months.    · Your health care provider may advise against a VBAC if you have preeclampsia (high blood pressure, protein in the urine, and swelling of face and extremities).    · VBAC is often successful if you previously gave birth vaginally.    · VBAC is often successful when the labor starts spontaneously before the due date.    · Delivering a baby through a VBAC is similar to having a normal spontaneous vaginal delivery.     This information is not intended to replace advice given to you by your health care provider. Make sure you discuss any questions you have with your health care provider.     Document Released: 11/16/2006 Document Revised: 06/16/2014 Document Reviewed: 12/23/2012  Elsevier Interactive Patient Education ©2016 [employer]

## 2015-07-10 NOTE — Progress Notes (Signed)
Subjective:  Jillian Mcguire is a 35 y.o. [id number] at [redacted]w[redacted]d being seen today for ongoing prenatal care.  Wants to have membranes swept. She is currently monitored for the following issues for this low-risk pregnancy and has Previous cesarean delivery affecting pregnancy, antepartum; PCO (polycystic ovaries); Supervision of normal pregnancy in first trimester; History of preterm delivery, currently pregnant in first trimester; and Low-lying placenta in second trimester on her problem list.  Patient reports occasional contractions.  Contractions: Irregular. Vag. Bleeding: None.  Movement: Present. Denies leaking of fluid.   The following portions of the patient's history were reviewed and updated as appropriate: allergies, current medications, past family history, past medical history, past social history, past surgical history and problem list. Problem list updated.  Objective:   Filed Vitals:   07/09/15 1052  BP: 116/70  Pulse: 78  Weight: 153 lb (69.4 kg)    Fetal Status:     Movement: Present     General:  Alert, oriented and cooperative. Patient is in no acute distress.  Skin: Skin is warm and dry. No rash noted.   Cardiovascular: Normal heart rate noted  Respiratory: Normal respiratory effort, no problems with respiration noted  Abdomen: Soft, gravid, appropriate for gestational age. Pain/Pressure: Present     Pelvic: Vag. Bleeding: None Vag D/C Character: Thick   Cervical exam performed      2.5/50/-3  Extremities: Normal range of motion.  Edema: Trace  Mental Status: Normal mood and affect. Normal behavior. Normal judgment and thought content.   Urinalysis:      Assessment and Plan:  Pregnancy: [name] at [redacted]w[redacted]d  There are no diagnoses linked to this encounter. Term labor symptoms and general obstetric precautions including but not limited to vaginal bleeding, contractions, leaking of fluid and fetal movement were reviewed in detail with the patient. Please refer to After  Visit Summary for other counseling recommendations.  F/U 07/13/15   Dorathy Kinsman, CNM

## 2015-07-13 ENCOUNTER — Ambulatory Visit (INDEPENDENT_AMBULATORY_CARE_PROVIDER_SITE_OTHER): Payer: Medicaid Other | Admitting: Family

## 2015-07-13 VITALS — BP 125/76 | HR 88 | Wt 152.0 lb

## 2015-07-13 DIAGNOSIS — Z3483 Encounter for supervision of other normal pregnancy, third trimester: Secondary | ICD-10-CM

## 2015-07-13 DIAGNOSIS — Z3493 Encounter for supervision of normal pregnancy, unspecified, third trimester: Secondary | ICD-10-CM

## 2015-07-13 NOTE — Progress Notes (Signed)
Subjective:  Jillian Mcguire is a 35 y.o. [id number] at [redacted]w[redacted]d being seen today for ongoing prenatal care.  She is currently monitored for the following issues for this high-risk pregnancy and has Previous cesarean delivery affecting pregnancy, antepartum; PCO (polycystic ovaries); Supervision of normal pregnancy in first trimester; History of preterm delivery, currently pregnant in first trimester; and Low-lying placenta in second trimester on her problem list.  Patient reports no bleeding and occasional contractions.  Contractions: Irritability. Vag. Bleeding: None.  Movement: Absent. Denies leaking of fluid.   The following portions of the patient's history were reviewed and updated as appropriate: allergies, current medications, past family history, past medical history, past social history, past surgical history and problem list. Problem list updated.  Objective:   Filed Vitals:   07/13/15 0909  BP: 125/76  Pulse: 88  Weight: 152 lb (68.947 kg)    Fetal Status: Fetal Heart Rate (bpm): 143   Movement: Absent     General:  Alert, oriented and cooperative. Patient is in no acute distress.  Skin: Skin is warm and dry. No rash noted.   Cardiovascular: Normal heart rate noted  Respiratory: Normal respiratory effort, no problems with respiration noted  Abdomen: Soft, gravid, appropriate for gestational age. Pain/Pressure: Present     Pelvic: Vag. Bleeding: None Vag D/C Character: Mucous   Cervical exam performed      2.5/50/-3  Extremities: Normal range of motion.  Edema: Trace  Mental Status: Normal mood and affect. Normal behavior. Normal judgment and thought content.   Urinalysis: Urine Protein: Negative Urine Glucose: Negative  Assessment and Plan:  Pregnancy: [name] at [redacted]w[redacted]d  1. Hx of Csection x 2, with one successful VBAC - Reviewed precautions to report (bleeding, increased pain, decreased fetal movement) - Desires TOLAC (consent on file); csection at 41 wks if not delivered  (already scheduled) -Appt next week with NST  Term labor symptoms and general obstetric precautions including but not limited to vaginal bleeding, contractions, leaking of fluid and fetal movement were reviewed in detail with the patient. Please refer to After Visit Summary for other counseling recommendations.  Return in about 1 week (around 07/20/2015).   Eino Farber Kennith Gain, CNM

## 2015-07-14 ENCOUNTER — Encounter (HOSPITAL_COMMUNITY): Payer: Self-pay | Admitting: *Deleted

## 2015-07-14 ENCOUNTER — Inpatient Hospital Stay (HOSPITAL_COMMUNITY)
Admission: AD | Admit: 2015-07-14 | Discharge: 2015-07-14 | Disposition: A | Discharge status: Home / Self Care | Payer: Medicaid Other | Source: Ambulatory Visit | Attending: Obstetrics & Gynecology | Admitting: Obstetrics & Gynecology

## 2015-07-14 DIAGNOSIS — O4442 Low lying placenta NOS or without hemorrhage, second trimester: Secondary | ICD-10-CM

## 2015-07-14 DIAGNOSIS — Z3493 Encounter for supervision of normal pregnancy, unspecified, third trimester: Secondary | ICD-10-CM | POA: Diagnosis present

## 2015-07-14 DIAGNOSIS — Z3491 Encounter for supervision of normal pregnancy, unspecified, first trimester: Secondary | ICD-10-CM

## 2015-07-14 NOTE — Progress Notes (Signed)
Joellyn Haff, CNM notified of pt arrival to [facility].  Notified of cervical exam results, FHR, and pregnancy history.  States pt can walk for one hour and be rechecked or can go home.  Pt decided to walk for one hour and be rechecked.

## 2015-07-14 NOTE — Progress Notes (Signed)
Joellyn Haff, CNM notified of pt cervical recheck.  Pt has made no change after one hour of ambulating in the halls.  Provider notified of reactive FHR tracing and two contractions since being placed back on the monitor.  Provider states to discharge home and educate pt on labor precautions and when to return to the hospital.

## 2015-07-14 NOTE — Discharge Instructions (Signed)
Third Trimester of Pregnancy °The third trimester is from week 29 through week 42, months 7 through 9. The third trimester is a time when the fetus is growing rapidly. At the end of the ninth month, the fetus is about 20 inches in length and weighs 6-10 pounds.  °BODY CHANGES °Your body goes through many changes during pregnancy. The changes vary from woman to woman.  °· Your weight will continue to increase. You can expect to gain 25-35 pounds (11-16 kg) by the end of the pregnancy. °· You may begin to get stretch marks on your hips, abdomen, and breasts. °· You may urinate more often because the fetus is moving lower into your pelvis and pressing on your bladder. °· You may develop or continue to have heartburn as a result of your pregnancy. °· You may develop constipation because certain hormones are causing the muscles that push waste through your intestines to slow down. °· You may develop hemorrhoids or swollen, bulging veins (varicose veins). °· You may have pelvic pain because of the weight gain and pregnancy hormones relaxing your joints between the bones in your pelvis. Backaches may result from overexertion of the muscles supporting your posture. °· You may have changes in your hair. These can include thickening of your hair, rapid growth, and changes in texture. Some women also have hair loss during or after pregnancy, or hair that feels dry or thin. Your hair will most likely return to normal after your baby is born. °· Your breasts will continue to grow and be tender. A yellow discharge may leak from your breasts called colostrum. °· Your belly button may stick out. °· You may feel short of breath because of your expanding uterus. °· You may notice the fetus "dropping," or moving lower in your abdomen. °· You may have a bloody mucus discharge. This usually occurs a few days to a week before labor begins. °· Your cervix becomes thin and soft (effaced) near your due date. °WHAT TO EXPECT AT YOUR PRENATAL  EXAMS  °You will have prenatal exams every 2 weeks until week 36. Then, you will have weekly prenatal exams. During a routine prenatal visit: °· You will be weighed to make sure you and the fetus are growing normally. °· Your blood pressure is taken. °· Your abdomen will be measured to track your baby's growth. °· The fetal heartbeat will be listened to. °· Any test results from the previous visit will be discussed. °· You may have a cervical check near your due date to see if you have effaced. °At around 36 weeks, your caregiver will check your cervix. At the same time, your caregiver will also perform a test on the secretions of the vaginal tissue. This test is to determine if a type of bacteria, Group B streptococcus, is present. Your caregiver will explain this further. °Your caregiver may ask you: °· What your birth plan is. °· How you are feeling. °· If you are feeling the baby move. °· If you have had any abnormal symptoms, such as leaking fluid, bleeding, severe headaches, or abdominal cramping. °· If you are using any tobacco products, including cigarettes, chewing tobacco, and electronic cigarettes. °· If you have any questions. °Other tests or screenings that may be performed during your third trimester include: °· Blood tests that check for low iron levels (anemia). °· Fetal testing to check the health, activity level, and growth of the fetus. Testing is done if you have certain medical conditions or if   there are problems during the pregnancy. °· HIV (human immunodeficiency virus) testing. If you are at high risk, you may be screened for HIV during your third trimester of pregnancy. °FALSE LABOR °You may feel small, irregular contractions that eventually go away. These are called Braxton Hicks contractions, or false labor. Contractions may last for hours, days, or even weeks before true labor sets in. If contractions come at regular intervals, intensify, or become painful, it is best to be seen by your  caregiver.  °SIGNS OF LABOR  °· Menstrual-like cramps. °· Contractions that are 5 minutes apart or less. °· Contractions that start on the top of the uterus and spread down to the lower abdomen and back. °· A sense of increased pelvic pressure or back pain. °· A watery or bloody mucus discharge that comes from the vagina. °If you have any of these signs before the 37th week of pregnancy, call your caregiver right away. You need to go to the hospital to get checked immediately. °HOME CARE INSTRUCTIONS  °· Avoid all smoking, herbs, alcohol, and unprescribed drugs. These chemicals affect the formation and growth of the baby. °· Do not use any tobacco products, including cigarettes, chewing tobacco, and electronic cigarettes. If you need help quitting, ask your health care provider. You may receive counseling support and other resources to help you quit. °· Follow your caregiver's instructions regarding medicine use. There are medicines that are either safe or unsafe to take during pregnancy. °· Exercise only as directed by your caregiver. Experiencing uterine cramps is a good sign to stop exercising. °· Continue to eat regular, healthy meals. °· Wear a good support bra for breast tenderness. °· Do not use hot tubs, steam rooms, or saunas. °· Wear your seat belt at all times when driving. °· Avoid raw meat, uncooked cheese, cat litter boxes, and soil used by cats. These carry germs that can cause birth defects in the baby. °· Take your prenatal vitamins. °· Take 1500-2000 mg of calcium daily starting at the 20th week of pregnancy until you deliver your baby. °· Try taking a stool softener (if your caregiver approves) if you develop constipation. Eat more high-fiber foods, such as fresh vegetables or fruit and whole grains. Drink plenty of fluids to keep your urine clear or pale yellow. °· Take warm sitz baths to soothe any pain or discomfort caused by hemorrhoids. Use hemorrhoid cream if your caregiver approves. °· If  you develop varicose veins, wear support hose. Elevate your feet for 15 minutes, 3-4 times a day. Limit salt in your diet. °· Avoid heavy lifting, wear low heal shoes, and practice good posture. °· Rest a lot with your legs elevated if you have leg cramps or low back pain. °· Visit your dentist if you have not gone during your pregnancy. Use a soft toothbrush to brush your teeth and be gentle when you floss. °· A sexual relationship may be continued unless your caregiver directs you otherwise. °· Do not travel far distances unless it is absolutely necessary and only with the approval of your caregiver. °· Take prenatal classes to understand, practice, and ask questions about the labor and delivery. °· Make a trial run to the hospital. °· Pack your hospital bag. °· Prepare the baby's nursery. °· Continue to go to all your prenatal visits as directed by your caregiver. °SEEK MEDICAL CARE IF: °· You are unsure if you are in labor or if your water has broken. °· You have dizziness. °· You have   mild pelvic cramps, pelvic pressure, or nagging pain in your abdominal area. °· You have persistent nausea, vomiting, or diarrhea. °· You have a bad smelling vaginal discharge. °· You have pain with urination. °SEEK IMMEDIATE MEDICAL CARE IF:  °· You have a fever. °· You are leaking fluid from your vagina. °· You have spotting or bleeding from your vagina. °· You have severe abdominal cramping or pain. °· You have rapid weight loss or gain. °· You have shortness of breath with chest pain. °· You notice sudden or extreme swelling of your face, hands, ankles, feet, or legs. °· You have not felt your baby move in over an hour. °· You have severe headaches that do not go away with medicine. °· You have vision changes. °  °This information is not intended to replace advice given to you by your health care provider. Make sure you discuss any questions you have with your health care provider. °  °Document Released: 05/20/2001 Document  Revised: 06/16/2014 Document Reviewed: 07/27/2012 °[employer]vier Interactive Patient Education ©2016 [employer] °Fetal Movement Counts °Patient Name: __________________________________________________ Patient Due Date: ____________________ °Performing a fetal movement count is highly recommended in high-risk pregnancies, but it is good for every pregnant woman to do. Your health care provider may ask you to start counting fetal movements at 28 weeks of the pregnancy. Fetal movements often increase: °· After eating a full meal. °· After physical activity. °· After eating or drinking something sweet or cold. °· At rest. °Pay attention to when you feel the baby is most active. This will help you notice a pattern of your baby's sleep and wake cycles and what factors contribute to an increase in fetal movement. It is important to perform a fetal movement count at the same time each day when your baby is normally most active.  °HOW TO COUNT FETAL MOVEMENTS °· Find a quiet and comfortable area to sit or lie down on your left side. Lying on your left side provides the best blood and oxygen circulation to your baby. °· Write down the day and time on a sheet of paper or in a journal. °· Start counting kicks, flutters, swishes, rolls, or jabs in a 2-hour period. You should feel at least 10 movements within 2 hours. °· If you do not feel 10 movements in 2 hours, wait 2-3 hours and count again. Look for a change in the pattern or not enough counts in 2 hours. °SEEK MEDICAL CARE IF: °· You feel less than 10 counts in 2 hours, tried twice. °· There is no movement in over an hour. °· The pattern is changing or taking longer each day to reach 10 counts in 2 hours. °· You feel the baby is not moving as he or she usually does. °Date: ____________ Movements: ____________ Start time: ____________ Finish time: ____________  °Date: ____________ Movements: ____________ Start time: ____________ Finish time: ____________ °Date: ____________  Movements: ____________ Start time: ____________ Finish time: ____________ °Date: ____________ Movements: ____________ Start time: ____________ Finish time: ____________ °Date: ____________ Movements: ____________ Start time: ____________ Finish time: ____________ °Date: ____________ Movements: ____________ Start time: ____________ Finish time: ____________ °Date: ____________ Movements: ____________ Start time: ____________ Finish time: ____________ °Date: ____________ Movements: ____________ Start time: ____________ Finish time: ____________  °Date: ____________ Movements: ____________ Start time: ____________ Finish time: ____________ °Date: ____________ Movements: ____________ Start time: ____________ Finish time: ____________ °Date: ____________ Movements: ____________ Start time: ____________ Finish time: ____________ °Date: ____________ Movements: ____________ Start time: ____________ Finish time: ____________ °Date:   ____________ Movements: ____________ Start time: ____________ Finish time: ____________ °Date: ____________ Movements: ____________ Start time: ____________ Finish time: ____________ °Date: ____________ Movements: ____________ Start time: ____________ Finish time: ____________  °Date: ____________ Movements: ____________ Start time: ____________ Finish time: ____________ °Date: ____________ Movements: ____________ Start time: ____________ Finish time: ____________ °Date: ____________ Movements: ____________ Start time: ____________ Finish time: ____________ °Date: ____________ Movements: ____________ Start time: ____________ Finish time: ____________ °Date: ____________ Movements: ____________ Start time: ____________ Finish time: ____________ °Date: ____________ Movements: ____________ Start time: ____________ Finish time: ____________ °Date: ____________ Movements: ____________ Start time: ____________ Finish time: ____________  °Date: ____________ Movements: ____________ Start time:  ____________ Finish time: ____________ °Date: ____________ Movements: ____________ Start time: ____________ Finish time: ____________ °Date: ____________ Movements: ____________ Start time: ____________ Finish time: ____________ °Date: ____________ Movements: ____________ Start time: ____________ Finish time: ____________ °Date: ____________ Movements: ____________ Start time: ____________ Finish time: ____________ °Date: ____________ Movements: ____________ Start time: ____________ Finish time: ____________ °Date: ____________ Movements: ____________ Start time: ____________ Finish time: ____________  °Date: ____________ Movements: ____________ Start time: ____________ Finish time: ____________ °Date: ____________ Movements: ____________ Start time: ____________ Finish time: ____________ °Date: ____________ Movements: ____________ Start time: ____________ Finish time: ____________ °Date: ____________ Movements: ____________ Start time: ____________ Finish time: ____________ °Date: ____________ Movements: ____________ Start time: ____________ Finish time: ____________ °Date: ____________ Movements: ____________ Start time: ____________ Finish time: ____________ °Date: ____________ Movements: ____________ Start time: ____________ Finish time: ____________  °Date: ____________ Movements: ____________ Start time: ____________ Finish time: ____________ °Date: ____________ Movements: ____________ Start time: ____________ Finish time: ____________ °Date: ____________ Movements: ____________ Start time: ____________ Finish time: ____________ °Date: ____________ Movements: ____________ Start time: ____________ Finish time: ____________ °Date: ____________ Movements: ____________ Start time: ____________ Finish time: ____________ °Date: ____________ Movements: ____________ Start time: ____________ Finish time: ____________ °Date: ____________ Movements: ____________ Start time: ____________ Finish time: ____________  °Date:  ____________ Movements: ____________ Start time: ____________ Finish time: ____________ °Date: ____________ Movements: ____________ Start time: ____________ Finish time: ____________ °Date: ____________ Movements: ____________ Start time: ____________ Finish time: ____________ °Date: ____________ Movements: ____________ Start time: ____________ Finish time: ____________ °Date: ____________ Movements: ____________ Start time: ____________ Finish time: ____________ °Date: ____________ Movements: ____________ Start time: ____________ Finish time: ____________ °Date: ____________ Movements: ____________ Start time: ____________ Finish time: ____________  °Date: ____________ Movements: ____________ Start time: ____________ Finish time: ____________ °Date: ____________ Movements: ____________ Start time: ____________ Finish time: ____________ °Date: ____________ Movements: ____________ Start time: ____________ Finish time: ____________ °Date: ____________ Movements: ____________ Start time: ____________ Finish time: ____________ °Date: ____________ Movements: ____________ Start time: ____________ Finish time: ____________ °Date: ____________ Movements: ____________ Start time: ____________ Finish time: ____________ °  °This information is not intended to replace advice given to you by your health care provider. Make sure you discuss any questions you have with your health care provider. °  °Document Released: 06/25/2006 Document Revised: 06/16/2014 Document Reviewed: 03/22/2012 °[employer] Interactive Patient Education ©2016 [employer] °Braxton Hicks Contractions °Contractions of the uterus can occur throughout pregnancy. Contractions are not always a sign that you are in labor.  °WHAT ARE BRAXTON HICKS CONTRACTIONS?  °Contractions that occur before labor are called Braxton Hicks contractions, or false labor. Toward the end of pregnancy (32-34 weeks), these contractions can develop more often and may become more  forceful. This is not true labor because these contractions do not result in opening (dilatation) and thinning of the cervix. They are sometimes difficult to tell apart from true labor because these contractions can be forceful and people have different pain tolerances. You should   not feel embarrassed if you go to the hospital with false labor. Sometimes, the only way to tell if you are in true labor is for your health care provider to look for changes in the cervix. °If there are no prenatal problems or other health problems associated with the pregnancy, it is completely safe to be sent home with false labor and await the onset of true labor. °HOW CAN YOU TELL THE DIFFERENCE BETWEEN TRUE AND FALSE LABOR? °False Labor °· The contractions of false labor are usually shorter and not as hard as those of true labor.   °· The contractions are usually irregular.   °· The contractions are often felt in the front of the lower abdomen and in the groin.   °· The contractions may go away when you walk around or change positions while lying down.   °· The contractions get weaker and are shorter lasting as time goes on.   °· The contractions do not usually become progressively stronger, regular, and closer together as with true labor.   °True Labor °· Contractions in true labor last 30-70 seconds, become very regular, usually become more intense, and increase in frequency.   °· The contractions do not go away with walking.   °· The discomfort is usually felt in the top of the uterus and spreads to the lower abdomen and low back.   °· True labor can be determined by your health care provider with an exam. This will show that the cervix is dilating and getting thinner.   °WHAT TO REMEMBER °· Keep up with your usual exercises and follow other instructions given by your health care provider.   °· Take medicines as directed by your health care provider.   °· Keep your regular prenatal appointments.   °· Eat and drink lightly if you  think you are going into labor.   °· If Braxton Hicks contractions are making you uncomfortable:   °· Change your position from lying down or resting to walking, or from walking to resting.   °· Sit and rest in a tub of warm water.   °· Drink 2-3 glasses of water. Dehydration may cause these contractions.   °· Do slow and deep breathing several times an hour.   °WHEN SHOULD I SEEK IMMEDIATE MEDICAL CARE? °Seek immediate medical care if: °· Your contractions become stronger, more regular, and closer together.   °· You have fluid leaking or gushing from your vagina.   °· You have a fever.   °· You pass blood-tinged mucus.   °· You have vaginal bleeding.   °· You have continuous abdominal pain.   °· You have low back pain that you never had before.   °· You feel your baby's head pushing down and causing pelvic pressure.   °· Your baby is not moving as much as it used to.   °  °This information is not intended to replace advice given to you by your health care provider. Make sure you discuss any questions you have with your health care provider. °  °Document Released: 05/26/2005 Document Revised: 05/31/2013 Document Reviewed: 03/07/2013 °Elsevier Interactive Patient Education ©2016 [employer] ° °

## 2015-07-14 NOTE — MAU Note (Signed)
Contractions started around 1330, averaging q 8 min. Little bit of bloody mucous, membranes swept yesterday.  Was 2 cm.  No leaking

## 2015-07-14 NOTE — MAU Note (Signed)
Ctx's returned when up and walking

## 2015-07-16 ENCOUNTER — Other Ambulatory Visit: Payer: Self-pay | Admitting: Family Medicine

## 2015-07-17 ENCOUNTER — Inpatient Hospital Stay (HOSPITAL_COMMUNITY)
Admission: AD | Admit: 2015-07-17 | Discharge: 2015-07-19 | DRG: 775 | Disposition: A | Discharge status: Home / Self Care | Payer: Medicaid Other | Source: Ambulatory Visit | Attending: Family Medicine | Admitting: Family Medicine

## 2015-07-17 ENCOUNTER — Encounter: Payer: Self-pay | Admitting: Obstetrics & Gynecology

## 2015-07-17 ENCOUNTER — Ambulatory Visit (INDEPENDENT_AMBULATORY_CARE_PROVIDER_SITE_OTHER): Payer: Medicaid Other | Admitting: Obstetrics & Gynecology

## 2015-07-17 ENCOUNTER — Encounter (HOSPITAL_COMMUNITY): Payer: Self-pay

## 2015-07-17 ENCOUNTER — Encounter (HOSPITAL_COMMUNITY): Payer: Self-pay | Admitting: Anesthesiology

## 2015-07-17 VITALS — BP 111/65 | HR 84 | Wt 154.0 lb

## 2015-07-17 DIAGNOSIS — O480 Post-term pregnancy: Secondary | ICD-10-CM

## 2015-07-17 DIAGNOSIS — Z833 Family history of diabetes mellitus: Secondary | ICD-10-CM

## 2015-07-17 DIAGNOSIS — O9081 Anemia of the puerperium: Secondary | ICD-10-CM | POA: Diagnosis not present

## 2015-07-17 DIAGNOSIS — Z3A40 40 weeks gestation of pregnancy: Secondary | ICD-10-CM | POA: Diagnosis not present

## 2015-07-17 DIAGNOSIS — O34211 Maternal care for low transverse scar from previous cesarean delivery: Secondary | ICD-10-CM | POA: Diagnosis not present

## 2015-07-17 DIAGNOSIS — Z3491 Encounter for supervision of normal pregnancy, unspecified, first trimester: Secondary | ICD-10-CM

## 2015-07-17 DIAGNOSIS — O9962 Diseases of the digestive system complicating childbirth: Secondary | ICD-10-CM | POA: Diagnosis present

## 2015-07-17 DIAGNOSIS — K219 Gastro-esophageal reflux disease without esophagitis: Secondary | ICD-10-CM | POA: Diagnosis present

## 2015-07-17 DIAGNOSIS — O34219 Maternal care for unspecified type scar from previous cesarean delivery: Secondary | ICD-10-CM | POA: Diagnosis present

## 2015-07-17 DIAGNOSIS — Z8249 Family history of ischemic heart disease and other diseases of the circulatory system: Secondary | ICD-10-CM

## 2015-07-17 DIAGNOSIS — O4442 Low lying placenta NOS or without hemorrhage, second trimester: Secondary | ICD-10-CM

## 2015-07-17 DIAGNOSIS — IMO0001 Reserved for inherently not codable concepts without codable children: Secondary | ICD-10-CM

## 2015-07-17 DIAGNOSIS — Z3483 Encounter for supervision of other normal pregnancy, third trimester: Secondary | ICD-10-CM

## 2015-07-17 DIAGNOSIS — Z3493 Encounter for supervision of normal pregnancy, unspecified, third trimester: Secondary | ICD-10-CM

## 2015-07-17 DIAGNOSIS — D649 Anemia, unspecified: Secondary | ICD-10-CM | POA: Diagnosis not present

## 2015-07-17 DIAGNOSIS — E282 Polycystic ovarian syndrome: Secondary | ICD-10-CM | POA: Diagnosis present

## 2015-07-17 LAB — ABO/RH: ABO/RH(D): A POS

## 2015-07-17 LAB — CBC
HCT: 33.6 % — ABNORMAL LOW (ref 36.0–46.0)
HEMOGLOBIN: 11.1 g/dL — AB (ref 12.0–15.0)
MCH: 28.8 pg (ref 26.0–34.0)
MCHC: 33 g/dL (ref 30.0–36.0)
MCV: 87.3 fL (ref 78.0–100.0)
Platelets: 211 10*3/uL (ref 150–400)
RBC: 3.85 MIL/uL — ABNORMAL LOW (ref 3.87–5.11)
RDW: 14.9 % (ref 11.5–15.5)
WBC: 8.8 10*3/uL (ref 4.0–10.5)

## 2015-07-17 LAB — TYPE AND SCREEN
ABO/RH(D): A POS
ANTIBODY SCREEN: NEGATIVE

## 2015-07-17 MED ORDER — OXYCODONE-ACETAMINOPHEN 5-325 MG PO TABS: 2.0000 | ORAL_TABLET | ORAL | Status: DC | PRN

## 2015-07-17 MED ORDER — OXYTOCIN BOLUS FROM INFUSION: 500.0000 mL | INTRAVENOUS | Status: DC

## 2015-07-17 MED ORDER — OXYCODONE-ACETAMINOPHEN 5-325 MG PO TABS
1.0000 | ORAL_TABLET | ORAL | Status: DC | PRN
Start: 2015-07-17 — End: 2015-07-19

## 2015-07-17 MED ORDER — LACTATED RINGERS IV SOLN: INTRAVENOUS | Status: DC

## 2015-07-17 MED ORDER — FENTANYL CITRATE (PF) 100 MCG/2ML IJ SOLN
100.0000 ug | INTRAMUSCULAR | Status: DC | PRN
  Filled 2015-07-17: qty 2

## 2015-07-17 MED ORDER — ONDANSETRON HCL 4 MG/2ML IJ SOLN: 4.0000 mg | Freq: Four times a day (QID) | INTRAMUSCULAR | Status: DC | PRN

## 2015-07-17 MED ORDER — OXYCODONE-ACETAMINOPHEN 5-325 MG PO TABS: 1.0000 | ORAL_TABLET | ORAL | Status: DC | PRN

## 2015-07-17 MED ORDER — OXYTOCIN 10 UNIT/ML IJ SOLN: 2.5000 [IU]/h | INTRAVENOUS | Status: DC

## 2015-07-17 MED ORDER — LIDOCAINE HCL (PF) 1 % IJ SOLN
30.0000 mL | INTRAMUSCULAR | Status: DC | PRN
Start: 2015-07-17 — End: 2015-07-17

## 2015-07-17 MED ORDER — CITRIC ACID-SODIUM CITRATE 334-500 MG/5ML PO SOLN: 30.0000 mL | ORAL | Status: DC | PRN

## 2015-07-17 MED ORDER — OXYTOCIN 10 UNIT/ML IJ SOLN
2.5000 [IU]/h | INTRAVENOUS | Status: DC
  Filled 2015-07-17: qty 4

## 2015-07-17 MED ORDER — LACTATED RINGERS IV SOLN: 500.0000 mL | INTRAVENOUS | Status: DC | PRN

## 2015-07-17 MED ORDER — LIDOCAINE HCL (PF) 1 % IJ SOLN: 30.0000 mL | INTRAMUSCULAR | Status: DC | PRN

## 2015-07-17 MED ORDER — ACETAMINOPHEN 325 MG PO TABS: 650.0000 mg | ORAL_TABLET | ORAL | Status: DC | PRN

## 2015-07-17 MED ORDER — LIDOCAINE HCL (PF) 1 % IJ SOLN
30.0000 mL | INTRAMUSCULAR | Status: DC | PRN
  Filled 2015-07-17: qty 30

## 2015-07-17 MED ORDER — FLEET ENEMA 7-19 GM/118ML RE ENEM: 1.0000 | ENEMA | RECTAL | Status: DC | PRN

## 2015-07-17 MED ORDER — LACTATED RINGERS IV SOLN
500.0000 mL | INTRAVENOUS | Status: DC | PRN
  Administered 2015-07-18: 250 mL via INTRAVENOUS

## 2015-07-17 MED ORDER — OXYTOCIN BOLUS FROM INFUSION
500.0000 mL | INTRAVENOUS | Status: DC
  Administered 2015-07-18 (×2): 500 mL via INTRAVENOUS

## 2015-07-17 MED ORDER — ACETAMINOPHEN 325 MG PO TABS
650.0000 mg | ORAL_TABLET | ORAL | Status: DC | PRN
  Administered 2015-07-19: 650 mg via ORAL
  Filled 2015-07-17 (×2): qty 2

## 2015-07-17 MED ORDER — LACTATED RINGERS IV SOLN
INTRAVENOUS | Status: DC
  Administered 2015-07-17 – 2015-07-18 (×3): via INTRAVENOUS

## 2015-07-17 NOTE — MAU Note (Signed)
Pt was at MD office today had her membranes swept and was 3-4 cm. Pt c/o contractions since then that are increasing in intensity and frequency.

## 2015-07-17 NOTE — Progress Notes (Signed)
Patient ID: Jillian Mcguire, female   DOB: Nov 06, 1980, 35 y.o.   MRN: [id number] Jillian Mcguire is a 35 y.o. [id number] at [redacted]w[redacted]d.  Subjective: Increased discomfort w/ UC's. Coping w/ comfort measures.   Objective: BP 111/63 mmHg  Pulse 88  Temp(Src) 98.5 F (36.9 C) (Oral)  Resp 20  Ht  (1.651 m)  Wt 154 lb (69.854 kg)  BMI 25.63 kg/m2  SpO2 97%   FHT:  FHR: 125 bpm, variability: mod,  accelerations:  15x15,  decelerations:  none UC:   Q 2-4 minutes, strong  Dilation: 7 Effacement (%): 100 Cervical Position: Middle Station: 0 Presentation: Vertex Exam by:: Ivonne Andrew. CNM  Labs: Results for orders placed or performed during the hospital encounter of 07/17/15 (from the past 24 hour(s))  CBC     Status: Abnormal   Collection Time: 07/17/15  6:15 PM  Result Value Ref Range   WBC 8.8 4.0 - 10.5 K/uL   RBC 3.85 (L) 3.87 - 5.11 MIL/uL   Hemoglobin 11.1 (L) 12.0 - 15.0 g/dL   HCT 14.7 (L) 82.9 - 56.2 %   MCV 87.3 78.0 - 100.0 fL   MCH 28.8 26.0 - 34.0 pg   MCHC 33.0 30.0 - 36.0 g/dL   RDW 13.0 86.5 - 78.4 %   Platelets 211 150 - 400 K/uL  Type and screen [facility]     Status: None   Collection Time: 07/17/15  6:15 PM  Result Value Ref Range   ABO/RH(D) A POS    Antibody Screen NEG    Sample Expiration 07/20/2015   ABO/Rh     Status: None   Collection Time: 07/17/15  6:15 PM  Result Value Ref Range   ABO/RH(D) A POS     Assessment / Plan: [redacted]w[redacted]d week IUP Labor: transition Fetal Wellbeing:  Category I Pain Control:  Comfort measures Anticipated MOD:  SVD  [place], CNM 07/17/2015 10:05 PM

## 2015-07-17 NOTE — H&P (Signed)
LABOR ADMISSION HISTORY AND PHYSICAL  Jillian Mcguire is a 35 y.o. female [phone] with IUP at [redacted]w[redacted]d by 8.5wk [place]. Pesenting with contractions. Patient reports her membranes were stripped in clinic today and she has been having contractions since. Contractions increased in severity around 4pm and were every 5-6 minutes. She reports +FM, + contractions, No LOF, no VB, no blurry vision, headaches or peripheral edema, and RUQ pain.  She plans on breast feeding. She is undecided about contraception  Dating: By 8.5wk [place] --->  Estimated Date of Delivery: 07/15/15  Sono:    , normal anatomy, 283g, 60% EFW @ [redacted]w[redacted]d, normal anatomy 1631g, 78% EFW   Prenatal History/Complications: Uncomplicated pregnancy  History c-section on previous pregnancy (twins) due to cord prolapse with second twin  Low lying placenta- resolved   Past Medical History: Past Medical History  Diagnosis Date  . PONV (postoperative nausea and vomiting)     Pt was put under general for CSection due to mal function with epidural  . PCOS (polycystic ovarian syndrome)   . Lactose intolerance   . Anxiety     Past Surgical History: Past Surgical History  Procedure Laterality Date  . Tonsillectomy  2005  . Breast enhancement surgery  2007  . Breast biopsy  2009  . Cholecystectomy  2009  . Cesarean section  2010  . Breast fibroid    . Cesarean section  10/07/2011    Procedure: CESAREAN SECTION;  Surgeon: Adam Phenix, MD;  Location: [facility];  Service: Gynecology;  Laterality: N/A;  Repair of second degree vaginal tear.    Obstetrical History: OB History    Gravida Para Term Preterm AB TAB SAB Ectopic Multiple Living   0 Social History: Social History   Social History  . Marital Status: Married    Spouse Name: N/A  . Number of Children: N/A  . Years of Education: N/A   Occupational History  . homemaker    Social History Main Topics  . Smoking status: Never Smoker   . Smokeless  tobacco: Never Used  . Alcohol Use: No  . Drug Use: No  . Sexual Activity:    Partners: Male     Comment: pt is pregnant   Other Topics Concern  . None   Social History Narrative    Family History: Family History  Problem Relation Age of Onset  . Heart disease Mother   . Fibroids Mother   . Endometriosis Mother   . Cancer Maternal Grandmother     ovarian  . Hypertension Maternal Grandfather   . Diabetes Maternal Grandfather   . Cancer Paternal Grandfather     brain tumor  . Cancer Paternal Grandmother     uterine  . Anesthesia problems Neg Hx     Allergies: Allergies  Allergen Reactions  . Erythromycin Swelling  . Flagyl [Metronidazole Hcl] Swelling  . Morphine And Related Nausea And Vomiting    Prescriptions prior to admission  Medication Sig Dispense Refill Last Dose  . calcium carbonate (TUMS EX) 750 MG chewable tablet Chew 1 tablet by mouth as needed for heartburn. Patient takes up to 6 tablets daily.   Taking  . Cholecalciferol (VITAMIN D3) 5000 units TABS Take 1 tablet by mouth daily.   Taking  . hydrocortisone (ANUSOL-HC) 2.5 % rectal cream Place 1 application rectally 2 (two) times daily. 30 g 0 Taking  . Krill Oil 1000 MG CAPS Take  1 capsule by mouth daily.   Taking  . Nutritional Supplements (JUICE PLUS FIBRE PO) Take 3 capsules by mouth 2 (two) times daily.    Taking  . Nutritional Supplements (NUTRITIONAL SUPPLEMENT PO) Take 2 capsules by mouth 2 (two) times daily. Patient is taking Drenamin manufactured by [employer].   Taking     Review of Systems   All systems reviewed and negative except as stated in HPI  Pulse 66  Temp(Src) 97.7 F (36.5 C) (Oral)  Resp 18  Ht  (1.651 m)  Wt 69.854 kg (154 lb)  BMI 25.63 kg/m2  SpO2 100% General appearance: alert and cooperative Lungs: clear to auscultation bilaterally Heart: regular rate and rhythm Abdomen: soft, non-tender; bowel sounds normal Extremities:  no sign of DVT, noedema Fetal  monitoringBaseline: 120 bpm, Variability: Good {> 6 bpm), Accelerations: Reactive and Decelerations: Absent Uterine activity: every 2-3 mins  Dilation: 5.5 Effacement (%): 80 Station: -2 Exam by:: cwicker,rnc   Prenatal labs: ABO, Rh: A/POS/-- (07/01 1040) Antibody: NEG (07/01 1040) Rubella: Immune RPR: NON REAC (11/11 1030)  HBsAg: NEGATIVE (07/01 1040)  HIV: NONREACTIVE (11/11 1030)  GBS: Negative (01/06 0000)  1 hr Glucola 132 Genetic screening  declined Anatomy US normal  Prenatal Transfer Tool  Maternal Diabetes: No Genetic Screening: Declined Maternal Ultrasounds/Referrals: Normal Fetal Ultrasounds or other Referrals:  None Maternal Substance Abuse:  No Significant Maternal Medications:  None Significant Maternal Lab Results: Lab values include: Group B Strep negative  No results found for this or any previous visit (from the past 24 hour(s)).  Patient Active Problem List   Diagnosis Date Noted  . Low-lying placenta in second trimester 02/25/2015  . Supervision of normal pregnancy in first trimester 12/08/2014  . History of preterm delivery, currently pregnant in first trimester 12/08/2014  . PCO (polycystic ovaries) 11/12/2011  . Previous cesarean delivery affecting pregnancy, antepartum 08/08/2011    Assessment: Jillian Mcguire is a 35 y.o. [id number] at [redacted]w[redacted]d here for SOL.   #Labor: SOL #Pain: Patient would like to attempt without medications, but is open to epidural #FWB: Cat 1  #ID: GBS neg #MOF: breast #MOC: undecided #Circ: gender unknown   Palma Holter, MD PGY 1 Family Medicine   I was present for the exam and agree with above.  Plans TOLAC. Consent under Media Tab. Understands that she would not be a candidate for pitocin augmentation if labor stalls.   [place], [place] 07/17/2015 10:15 PM

## 2015-07-17 NOTE — Progress Notes (Signed)
Subjective:  Jillian Mcguire is a 35 y.o. [id number] at 105w2d being seen today for ongoing prenatal care.  She is currently monitored for the following issues for this low-risk pregnancy.    Patient reports contractions and pelvic pressure.  Contractions: Irregular. Vag. Bleeding: None.  Movement: Present. Denies leaking of fluid.   The following portions of the patient's history were reviewed and updated as appropriate: allergies, current medications, past family history, past medical history, past social history, past surgical history and problem list. Problem list updated.  Objective:   Filed Vitals:   07/17/15 1437  BP: 111/65  Pulse: 84  Weight: 154 lb (69.854 kg)    Fetal Status: Fetal Heart Rate (bpm): NST   Movement: Present  Presentation: Vertex  General:  Alert, oriented and cooperative. Patient is in no acute distress.  Skin: Skin is warm and dry. No rash noted.   Cardiovascular: Normal heart rate noted  Respiratory: Normal respiratory effort, no problems with respiration noted  Abdomen: Soft, gravid, appropriate for gestational age. Pain/Pressure: Present     Pelvic: Vag. Bleeding: None Vag D/C Character: Thin   Cervical exam performed        Extremities: Normal range of motion.  Edema: Trace  Mental Status: Normal mood and affect. Normal behavior. Normal judgment and thought content.   Urinalysis: Urine Protein: Negative Urine Glucose: Negative  Assessment and Plan:  Pregnancy: [name] at [redacted]w[redacted]d  1. Prenatal care in third trimester -AFI = 22 - Fetal nonstress test  2. Supervision of normal pregnancy in first trimester NST Friday Membranes swept today C/S on Sunday if no labor.  Term labor symptoms and general obstetric precautions including but not limited to vaginal bleeding, contractions, leaking of fluid and fetal movement were reviewed in detail with the patient. Please refer to After Visit Summary for other counseling recommendations.  Return in about 3  days (around 07/20/2015).   Lesly Dukes, MD

## 2015-07-18 ENCOUNTER — Inpatient Hospital Stay (HOSPITAL_COMMUNITY): Payer: Medicaid Other | Admitting: Anesthesiology

## 2015-07-18 ENCOUNTER — Encounter (HOSPITAL_COMMUNITY): Payer: Self-pay | Admitting: Anesthesiology

## 2015-07-18 DIAGNOSIS — O34211 Maternal care for low transverse scar from previous cesarean delivery: Secondary | ICD-10-CM

## 2015-07-18 DIAGNOSIS — Z3A40 40 weeks gestation of pregnancy: Secondary | ICD-10-CM

## 2015-07-18 LAB — RPR: RPR: NONREACTIVE

## 2015-07-18 MED ORDER — SENNA 8.6 MG PO TABS: 2.0000 | ORAL_TABLET | Freq: Every day | ORAL | Status: DC

## 2015-07-18 MED ORDER — EPHEDRINE 5 MG/ML INJ
10.0000 mg | INTRAVENOUS | Status: DC | PRN
  Filled 2015-07-18: qty 2

## 2015-07-18 MED ORDER — ACETAMINOPHEN 325 MG PO TABS
650.0000 mg | ORAL_TABLET | Freq: Four times a day (QID) | ORAL | Status: DC | PRN
  Administered 2015-07-18 (×2): 650 mg via ORAL
  Filled 2015-07-18: qty 2

## 2015-07-18 MED ORDER — PHENYLEPHRINE 40 MCG/ML (10ML) SYRINGE FOR IV PUSH (FOR BLOOD PRESSURE SUPPORT)
80.0000 ug | PREFILLED_SYRINGE | INTRAVENOUS | Status: DC | PRN
  Filled 2015-07-18: qty 2

## 2015-07-18 MED ORDER — LIDOCAINE HCL (PF) 1 % IJ SOLN
INTRAMUSCULAR | Status: DC | PRN
  Administered 2015-07-18 (×2): 4 mL via EPIDURAL

## 2015-07-18 MED ORDER — BENZOCAINE-MENTHOL 20-0.5 % EX AERO
1.0000 "application " | INHALATION_SPRAY | Freq: Four times a day (QID) | CUTANEOUS | Status: DC | PRN
  Filled 2015-07-18: qty 56

## 2015-07-18 MED ORDER — IBUPROFEN 600 MG PO TABS
ORAL_TABLET | ORAL | Status: AC
  Filled 2015-07-18: qty 1

## 2015-07-18 MED ORDER — FENTANYL 2.5 MCG/ML BUPIVACAINE 1/10 % EPIDURAL INFUSION (WH - ANES)
14.0000 mL/h | INTRAMUSCULAR | Status: DC | PRN
  Administered 2015-07-18: 14 mL/h via EPIDURAL

## 2015-07-18 MED ORDER — IBUPROFEN 600 MG PO TABS
600.0000 mg | ORAL_TABLET | Freq: Four times a day (QID) | ORAL | Status: DC | PRN
  Administered 2015-07-18 – 2015-07-19 (×5): 600 mg via ORAL
  Filled 2015-07-18 (×5): qty 1

## 2015-07-18 MED ORDER — LACTATED RINGERS IV SOLN
125.0000 mL/h | INTRAVENOUS | Status: DC
Start: 2015-07-18 — End: 2015-07-19

## 2015-07-18 MED ORDER — DIPHENHYDRAMINE HCL 50 MG/ML IJ SOLN: 12.5000 mg | INTRAMUSCULAR | Status: DC | PRN

## 2015-07-18 MED ORDER — SENNOSIDES-DOCUSATE SODIUM 8.6-50 MG PO TABS
2.0000 | ORAL_TABLET | Freq: Every day | ORAL | Status: DC
  Administered 2015-07-18: 2 via ORAL
  Filled 2015-07-18: qty 2

## 2015-07-18 MED ORDER — FENTANYL 2.5 MCG/ML BUPIVACAINE 1/10 % EPIDURAL INFUSION (WH - ANES)
INTRAMUSCULAR | Status: AC
  Filled 2015-07-18: qty 125

## 2015-07-18 MED ORDER — MISOPROSTOL 200 MCG PO TABS
800.0000 ug | ORAL_TABLET | Freq: Once | ORAL | Status: AC
  Administered 2015-07-18: 800 ug via ORAL

## 2015-07-18 MED ORDER — PHENYLEPHRINE 40 MCG/ML (10ML) SYRINGE FOR IV PUSH (FOR BLOOD PRESSURE SUPPORT)
PREFILLED_SYRINGE | INTRAVENOUS | Status: AC
  Filled 2015-07-18: qty 20

## 2015-07-18 MED ORDER — MISOPROSTOL 200 MCG PO TABS
ORAL_TABLET | ORAL | Status: AC
  Filled 2015-07-18: qty 4

## 2015-07-18 MED ORDER — CEFAZOLIN SODIUM-DEXTROSE 2-3 GM-% IV SOLR
2.0000 g | INTRAVENOUS | Status: DC
  Filled 2015-07-18: qty 50

## 2015-07-18 NOTE — Lactation Note (Signed)
This note was copied from a baby's chart. Lactation Consultation Note  Patient Name: Jillian Mcguire OZHYQ'M Date: 07/18/2015 Reason for consult: Initial assessment Baby at 16 hr of life and mom reports feeding are going well. She pumped for her oldest for 12 months. She bf her twins 17 months. She has a DEBP at home. Discussed baby behavior, feeding frequency, baby belly size, voids, wt loss, breast changes, and nipple care. Mom stated that she can manually express and has seen colostrum. She has a spoon in the room. Given lactation handouts. Aware of OP services and support group.     Maternal Data    Feeding Feeding Type: Breast Fed Length of feed: 20 min  LATCH Score/Interventions                      Lactation Tools Discussed/Used     Consult Status Consult Status: PRN    [name] 07/18/2015, 9:01 PM

## 2015-07-18 NOTE — Anesthesia Postprocedure Evaluation (Signed)
Anesthesia Post Note  Patient: Jillian Mcguire  Procedure(s) Performed: * No procedures listed *  Patient location during evaluation: Mother Baby Anesthesia Type: Epidural Level of consciousness: awake and alert Pain management: pain level controlled Vital Signs Assessment: post-procedure vital signs reviewed and stable Respiratory status: spontaneous breathing, nonlabored ventilation and respiratory function stable Cardiovascular status: stable Postop Assessment: no headache, no backache and epidural receding Anesthetic complications: no Comments: Patient stated she had left side pain with contractions close to end that shot down her left leg. Otherwise epidural worked great. Pain score now is 1.    Last Vitals:  Filed Vitals:   07/18/15 0835 07/18/15 0935  BP: 103/56 95/55  Pulse: 72 87  Temp: 37.2 C 37.1 C  Resp: 18 18    Last Pain:  Filed Vitals:   07/18/15 1018  PainSc: 2                  Roseanne Juenger

## 2015-07-18 NOTE — Progress Notes (Signed)
MOB was referred for history of depression/anxiety.  Referral is screened out by Clinical Social Worker because none of the following criteria appear to apply: -History of anxiety/depression during this pregnancy, or of post-partum depression. - Diagnosis of anxiety and/or depression within last 3 years.  Per chart, history has been present since prior to 2012.  or -MOB's symptoms are currently being treated with medication and/or therapy.  Please contact the Clinical Social Worker if needs arise or upon MOB request.  

## 2015-07-18 NOTE — Anesthesia Procedure Notes (Signed)
Epidural Patient location during procedure: OB Start time: 07/18/2015 1:05 AM  Staffing Anesthesiologist: Mal Amabile Performed by: anesthesiologist   Preanesthetic Checklist Completed: patient identified, site marked, surgical consent, pre-op evaluation, timeout performed, IV checked, risks and benefits discussed and monitors and equipment checked  Epidural Patient position: sitting Prep: site prepped and draped and DuraPrep Patient monitoring: continuous pulse ox and blood pressure Approach: midline Location: L3-L4 Injection technique: LOR air  Needle:  Needle type: Tuohy  Needle gauge: 17 G Needle length: 9 cm and 9 Needle insertion depth: 5 cm cm Catheter type: closed end flexible Catheter size: 19 Gauge Catheter at skin depth: 10 cm Test dose: negative and Other  Assessment Events: blood not aspirated, injection not painful, no injection resistance, negative IV test and no paresthesia  Additional Notes Patient identified. Risks and benefits discussed including failed block, incomplete  Pain control, post dural puncture headache, nerve damage, paralysis, blood pressure Changes, nausea, vomiting, reactions to medications-both toxic and allergic and post Partum back pain. All questions were answered. Patient expressed understanding and wished to proceed. Sterile technique was used throughout procedure. Epidural site was Dressed with sterile barrier dressing. No paresthesias, signs of intravascular injection Or signs of intrathecal spread were encountered.  Patient was more comfortable after the epidural was dosed. Please see RN's note for documentation of vital signs and FHR which are stable.

## 2015-07-18 NOTE — Anesthesia Preprocedure Evaluation (Signed)
Anesthesia Evaluation  Patient identified by MRN, date of birth, ID band Patient awake    Reviewed: Allergy & Precautions, NPO status , Patient's Chart, lab work & pertinent test results  History of Anesthesia Complications (+) PONV and history of anesthetic complications  Airway Mallampati: II  TM Distance: >3 FB Neck ROM: Full    Dental no notable dental hx. (+) Teeth Intact   Pulmonary neg pulmonary ROS,    Pulmonary exam normal breath sounds clear to auscultation       Cardiovascular negative cardio ROS Normal cardiovascular exam Rhythm:Regular Rate:Normal     Neuro/Psych Anxiety negative neurological ROS     GI/Hepatic Neg liver ROS, GERD  Medicated and Controlled,  Endo/Other  negative endocrine ROS  Renal/GU negative Renal ROS  negative genitourinary   Musculoskeletal negative musculoskeletal ROS (+)   Abdominal   Peds  Hematology  (+) anemia ,   Anesthesia Other Findings   Reproductive/Obstetrics (+) Pregnancy Previous C/Section x 2 TOLAC                             Anesthesia Physical Anesthesia Plan  ASA: II  Anesthesia Plan: Epidural   Post-op Pain Management:    Induction:   Airway Management Planned: Natural Airway  Additional Equipment:   Intra-op Plan:   Post-operative Plan:   Informed Consent: I have reviewed the patients History and Physical, chart, labs and discussed the procedure including the risks, benefits and alternatives for the proposed anesthesia with the patient or authorized representative who has indicated his/her understanding and acceptance.     Plan Discussed with: Anesthesiologist  Anesthesia Plan Comments:         Anesthesia Quick Evaluation

## 2015-07-19 ENCOUNTER — Encounter (HOSPITAL_COMMUNITY): Payer: Self-pay | Admitting: Advanced Practice Midwife

## 2015-07-19 LAB — CBC
HCT: 28.3 % — ABNORMAL LOW (ref 36.0–46.0)
Hemoglobin: 9.1 g/dL — ABNORMAL LOW (ref 12.0–15.0)
MCH: 28.2 pg (ref 26.0–34.0)
MCHC: 32.2 g/dL (ref 30.0–36.0)
MCV: 87.6 fL (ref 78.0–100.0)
PLATELETS: 178 10*3/uL (ref 150–400)
RBC: 3.23 MIL/uL — ABNORMAL LOW (ref 3.87–5.11)
RDW: 15.3 % (ref 11.5–15.5)
WBC: 8.2 10*3/uL (ref 4.0–10.5)

## 2015-07-19 LAB — HIV ANTIBODY (ROUTINE TESTING W REFLEX): HIV Screen 4th Generation wRfx: NONREACTIVE

## 2015-07-19 MED ORDER — IBUPROFEN 600 MG PO TABS: 600.0000 mg | ORAL_TABLET | Freq: Four times a day (QID) | ORAL | Status: DC | PRN

## 2015-07-19 NOTE — Discharge Instructions (Signed)

## 2015-07-19 NOTE — Discharge Summary (Signed)
OB Discharge Summary     Patient Name: Jillian Mcguire DOB: [birth year] MRN: [id number]  Date of admission: 07/17/2015 Delivering MD: Dorathy Kinsman   Date of discharge: 07/19/2015  Admitting diagnosis: 40.2w ctx 4-5 min apart 4th baby cpt [phone] - REPEAT c-section Intrauterine pregnancy: [redacted]w[redacted]d     Secondary diagnosis:  Active Problems:   Active labor at term   Normal labor and delivery  Additional problems:     Discharge diagnosis: Term Pregnancy Delivered- VBAC                                                                                                Post partum procedures: None  Augmentation: None  Complications: 1 min Shoulder dystocia  Hospital course:  Onset of Labor With Vaginal Delivery     35 y.o. yo [id number] at [redacted]w[redacted]d was admitted in Latent Labor on 07/17/2015. Patient had an uncomplicated labor course as follows:  Membrane Rupture Time/Date: 3:57 AM ,07/18/2015   Intrapartum Procedures: Episiotomy: None [1]                                         Lacerations:  2nd degree [3]  Patient had a delivery of a Viable infant. 07/18/2015  Information for the patient's newborn:  Jillian Mcguire [[id number]]  Delivery Method: Vaginal, Spontaneous Delivery (Filed from Delivery Summary)- VBAC    Pateint had an uncomplicated postpartum course.  She is ambulating, tolerating a regular diet, passing flatus, and urinating well. Patient is discharged home in stable condition on 07/19/2015.    Physical exam  Filed Vitals:   07/18/15 0935 07/18/15 1320 07/18/15 1952 07/19/15 0631  BP: 94/54  Pulse: 87 77 68 63  Temp: 98.8 F (37.1 C) 98.3 F (36.8 C) 98.3 F (36.8 C) 97.7 F (36.5 C)  TempSrc: Oral Oral Oral Tympanic  Resp: Height:      Weight:      SpO2: 98% 99%     General: alert, cooperative and no distress Lochia: appropriate Uterine Fundus: Firm and below the umbilicus Incision: None DVT Evaluation: No evidence of DVT, no  pitting edema Labs: Lab Results  Component Value Date   WBC 8.2 07/19/2015   HGB 9.1* 07/19/2015   HCT 28.3* 07/19/2015   MCV 87.6 07/19/2015   PLT 178 07/19/2015   No flowsheet data found.  Discharge instruction: per After Visit Summary and "Baby and Me Booklet".  After visit meds:    Medication List    STOP taking these medications        calcium carbonate 750 MG chewable tablet  Commonly known as:  TUMS EX     Vitamin D3 5000 units Tabs      TAKE these medications        ibuprofen 600 MG tablet  Commonly known as:  ADVIL,MOTRIN  Take 1 tablet (600 mg total) by mouth every 6 (six) hours as needed for fever or mild pain.  IRON PO  Take 1 tablet by mouth daily.     Krill Oil 1000 MG Caps  Take 1 capsule by mouth daily.     OVER THE COUNTER MEDICATION  Take 3 tablets by mouth 2 (two) times daily. Fruit, vegetable and berry blends. Vitamins meet pregnancy folic acid requirements.        Diet: routine diet  Activity: Advance as tolerated. Pelvic rest for 6 weeks.   Outpatient follow up:6 weeks Follow up Appt:No future appointments. Follow up Visit:No Follow-up on file.  Postpartum contraception: Undecided, considering vasectomy  Newborn Data: Live born female  Birth Weight: 10 lb 2.4 oz (4605 g) APGAR: 7, 8  Baby Feeding: Breast Disposition:home with mother   07/19/2015 Esmond Camper, Student-PA   CNM attestation  Jillian Mcguire is a 35 y.o. [phone] s/p VBAC.   Pain is well controlled.  Plan for birth control is undecided.  Method of Feeding: breast  PE:  BP 94/54 mmHg  Pulse 63  Temp(Src) 97.7 F (36.5 C) (Tympanic)  Resp 16  Ht  (1.651 m)  Wt 69.854 kg (154 lb)  BMI 25.63 kg/m2  SpO2 99%  Breastfeeding? Unknown Fundus firm   Recent Labs  07/17/15 1815 07/19/15 0533  HGB 11.1* 9.1*  HCT 33.6* 28.3*     Plan: discharge today - postpartum care discussed - f/u clinic in 6 weeks for postpartum visit   Cam Hai,  CNM 8:43 AM 07/19/2015

## 2015-07-19 NOTE — Progress Notes (Signed)
Post Partum Day 1  Subjective:  Jillian Mcguire is a 36 y.o. [id number] 109w3d s/p VBAC.  No acute events overnight.  Ptdeniesproblems with ambulating, voiding or po intake.  She denies nausea or vomiting.  Pain is well controlled.  She has had flatus. She has had bowel movement.  Lochia Moderate.  Plan for birth control is undecided, considering vasectomy.  Method of Feeding: breast feeding  Objective: BP 94/54 mmHg  Pulse 63  Temp(Src) 97.7 F (36.5 C) (Tympanic)  Resp 16  Ht  (1.651 m)  Wt 69.854 kg (154 lb)  BMI 25.63 kg/m2  SpO2 99%  Breastfeeding? Unknown  Physical Exam:  General: alert, cooperative and no distress Lochia:normal flow Chest: CTAB Heart: RRR no m/r/g Abdomen: +BS, soft, nontender, fundus firm at/below umbilicus Uterine Fundus: firm,  DVT Evaluation: No evidence of DVT seen on physical exam. Extremities: +1 edema   Recent Labs  07/17/15 1815 07/19/15 0533  HGB 11.1* 9.1*  HCT 33.6* 28.3*    Assessment/Plan:  ASSESSMENT: Jillian Mcguire is a 35 y.o. [id number] [redacted]w[redacted]d ppd #1 s/pVBAC doing well.  1) PPD#1: Encouraged continued ambulation. Pain is well controlled.  2) Breast feeding 3) Anemia: consider Fe supplementation and increased consumption of iron rich foods.  Disposition Discharge home if pt. Is feeling ready to leave this afternoon s/p scheduled inpt. circ   LOS: 2 days   Esmond Camper 07/19/2015, 7:26 AM   See Discharge Summary Cam Hai [facility] 07/19/2015 8:44 AM

## 2015-07-20 ENCOUNTER — Encounter: Payer: Medicaid Other | Admitting: Family

## 2015-07-20 ENCOUNTER — Inpatient Hospital Stay (HOSPITAL_COMMUNITY): Admission: RE | Admit: 2015-07-20 | Payer: Medicaid Other | Source: Ambulatory Visit | Admitting: Family Medicine

## 2015-07-20 SURGERY — Surgical Case
Anesthesia: Regional

## 2015-07-22 ENCOUNTER — Encounter (HOSPITAL_COMMUNITY)
Admission: AD | Disposition: A | Discharge status: Home / Self Care | Payer: Self-pay | Source: Ambulatory Visit | Attending: Family Medicine

## 2015-07-22 SURGERY — Surgical Case
Anesthesia: Regional | Site: Abdomen

## 2015-08-01 ENCOUNTER — Encounter: Payer: Self-pay | Admitting: Obstetrics & Gynecology

## 2015-08-01 ENCOUNTER — Ambulatory Visit (INDEPENDENT_AMBULATORY_CARE_PROVIDER_SITE_OTHER): Payer: Medicaid Other | Admitting: Obstetrics & Gynecology

## 2015-08-01 VITALS — BP 102/66 | HR 100 | Temp 97.6°F | Resp 16 | Ht 65.0 in | Wt 134.0 lb

## 2015-08-01 DIAGNOSIS — N61 Mastitis without abscess: Secondary | ICD-10-CM

## 2015-08-01 MED ORDER — AMOXICILLIN-POT CLAVULANATE 875-125 MG PO TABS: 1.0000 | ORAL_TABLET | Freq: Two times a day (BID) | ORAL | Status: DC

## 2015-08-01 MED ORDER — LIDOCAINE HCL 2 % EX GEL: 1.0000 "application " | CUTANEOUS | Status: DC | PRN

## 2015-08-01 MED ORDER — OXYCODONE-ACETAMINOPHEN 5-325 MG PO TABS: 1.0000 | ORAL_TABLET | ORAL | Status: DC | PRN

## 2015-08-01 NOTE — Progress Notes (Signed)
   Subjective:    Patient ID: Jillian Mcguire, female    DOB: 11-13-1980, 35 y.o.   MRN: [id number]  HPI  35 yo breastfeeding mom of a newborn is here with several days of almost intolerable left breast pain. She has taken 4 doses of dicloxacillin for the mastitis but it is no better and the diclox is making her nauseated.  Review of Systems     Objective:   Physical Exam Left breast engorged, red, very tender WNWHWF Clearly in pain       Assessment & Plan:  Mastitis- continue breastfeeding/pumping, add heat Change abx to augmentin, continue BID probiotics Percocet prn, no more tylenol Lidocaine cream prn

## 2015-08-08 ENCOUNTER — Ambulatory Visit (INDEPENDENT_AMBULATORY_CARE_PROVIDER_SITE_OTHER): Payer: Medicaid Other | Admitting: Obstetrics & Gynecology

## 2015-08-08 ENCOUNTER — Encounter: Payer: Self-pay | Admitting: Obstetrics & Gynecology

## 2015-08-08 VITALS — BP 106/70 | HR 62 | Resp 16 | Ht 65.0 in | Wt 130.0 lb

## 2015-08-08 DIAGNOSIS — N61 Mastitis without abscess: Secondary | ICD-10-CM | POA: Diagnosis not present

## 2015-08-08 NOTE — Progress Notes (Signed)
   Subjective:    Patient ID: Jillian Mcguire, female    DOB: 21-Mar-1981, 35 y.o.   MRN: [id number]  HPI  35 yo MW lady here for followup of her left mastitis. She has 2 more days left of her Augmentin. She had a u/s of her breast last week that did not show any abscess. She has had great improvement.  Review of Systems     Objective:   Physical Exam WNWHWFNAD Breathing, conversing, and ambulating normally Left breast with wonderful improvement, soft, NT, minimally red       Assessment & Plan:  Mastitis- improving Continue Augmentin for the full 1 week course Keep PP appt

## 2015-08-29 ENCOUNTER — Encounter: Payer: Self-pay | Admitting: Obstetrics & Gynecology

## 2015-08-29 ENCOUNTER — Ambulatory Visit (INDEPENDENT_AMBULATORY_CARE_PROVIDER_SITE_OTHER): Payer: Medicaid Other | Admitting: Obstetrics & Gynecology

## 2015-08-29 VITALS — BP 107/67 | HR 61 | Wt 134.0 lb

## 2015-08-29 DIAGNOSIS — Z8759 Personal history of other complications of pregnancy, childbirth and the puerperium: Secondary | ICD-10-CM

## 2015-08-29 NOTE — Progress Notes (Signed)
Patient ID: Jillian CarasJennifer L Obryant, female   DOB: [birth year], 35 y.o.   MRN: [id number] Post Partum Exam  Jillian CarasJennifer L Bergeron is a 35 y.o. [id number] female who presents for a postpartum visit. She is 6 weeks postpartum following a spontaneous vaginal delivery. I have fully reviewed the prenatal and intrapartum course. The delivery was at 40.3 gestational weeks.  Anesthesia: epidural. Postpartum course has been significant for mastitis (treated x2). Baby's course has been dealing with some constipation. Baby is feeding by breast. Bleeding staining only. Bowel function is normal. Bladder function is normal. Patient is not sexually active. Contraception method is abstinence and vasectomy. Postpartum depression screening: score:8.  The following portions of the patient's history were reviewed and updated as appropriate: allergies, current medications, past family history, past medical history, past social history, past surgical history and problem list.  Review of Systems Pertinent items noted in HPI and remainder of comprehensive ROS otherwise negative.   Objective:    BP 116/78 mmHg  Pulse 78  Resp 16  Ht 5\' 5"  (1.651 m)  Wt 211 lb (95.709 kg)  BMI 35.11 kg/m2  Breastfeeding? Yes  General:  alert, cooperative and no distress   Breasts:  inspection negative, no nipple discharge or bleeding, no masses or nodularity palpable  Lungs: clear to auscultation bilaterally  Heart:  regular rate and rhythm  Abdomen: soft, non-tender; bowel sounds normal; no masses,  no organomegaly   Vulva:  normal  Vagina: normal vagina  Cervix:  no cervical motion tenderness  Corpus: normal size, contour, position, consistency, mobility, non-tender  Adnexa:  no mass, fullness, tenderness  Rectal Exam: external hemorrhoids        Assessment:    Nml postpartum exam. Continue antibiotics for mastitis (resolved right now on Diclox)  Plan:    1. Contraception: condoms and considering vasectomy 2. Mom needs My risk  testing (grandmother with history of ovarian cancer) 3. Follow up in: 1 year or as needed.

## 2018-03-10 ENCOUNTER — Encounter: Payer: Self-pay | Admitting: *Deleted

## 2018-03-10 DIAGNOSIS — Z3480 Encounter for supervision of other normal pregnancy, unspecified trimester: Secondary | ICD-10-CM | POA: Insufficient documentation

## 2018-03-12 ENCOUNTER — Other Ambulatory Visit (HOSPITAL_COMMUNITY)
Admission: RE | Admit: 2018-03-12 | Discharge: 2018-03-12 | Disposition: A | Discharge status: Home / Self Care | Payer: Medicaid Other | Source: Ambulatory Visit | Attending: Advanced Practice Midwife | Admitting: Advanced Practice Midwife

## 2018-03-12 ENCOUNTER — Ambulatory Visit (INDEPENDENT_AMBULATORY_CARE_PROVIDER_SITE_OTHER): Payer: Medicaid Other | Admitting: Advanced Practice Midwife

## 2018-03-12 ENCOUNTER — Encounter: Payer: Self-pay | Admitting: Advanced Practice Midwife

## 2018-03-12 VITALS — BP 101/62 | HR 78 | Wt 129.0 lb

## 2018-03-12 DIAGNOSIS — Z3687 Encounter for antenatal screening for uncertain dates: Secondary | ICD-10-CM

## 2018-03-12 DIAGNOSIS — Z98891 History of uterine scar from previous surgery: Secondary | ICD-10-CM

## 2018-03-12 DIAGNOSIS — Z3481 Encounter for supervision of other normal pregnancy, first trimester: Secondary | ICD-10-CM

## 2018-03-12 DIAGNOSIS — Z3480 Encounter for supervision of other normal pregnancy, unspecified trimester: Secondary | ICD-10-CM | POA: Diagnosis present

## 2018-03-12 DIAGNOSIS — O34219 Maternal care for unspecified type scar from previous cesarean delivery: Secondary | ICD-10-CM

## 2018-03-12 NOTE — Progress Notes (Signed)
Bedside U/S shows single IUP with FHT of 171 BPM and CRL 30.96mm GA is 10w

## 2018-03-12 NOTE — Progress Notes (Signed)
   PRENATAL VISIT NOTE  Subjective:  Jillian Mcguire is a 37 y.o. [phone] at [redacted]w[redacted]d being seen today for initial prenatal visit.  She is currently monitored for the following issues for this low-risk pregnancy and has Previous cesarean delivery affecting pregnancy, antepartum; PCO (polycystic ovaries); History of preterm delivery, currently pregnant in first trimester; History of shoulder dystocia in prior pregnancy; and Supervision of other normal pregnancy, antepartum on their problem list.  Patient reports no complaints.   . Vag. Bleeding: None.   . Denies leaking of fluid.   The following portions of the patient's history were reviewed and updated as appropriate: allergies, current medications, past family history, past medical history, past social history, past surgical history and problem list. Problem list updated.  Objective:   Vitals:   03/12/18 0912  BP: 101/62  Pulse: 78  Weight: 58.5 kg    Fetal Status: Fetal Heart Rate (bpm): 171         General:  Alert, oriented and cooperative. Patient is in no acute distress.  Skin: Skin is warm and dry. No rash noted.   Cardiovascular: Normal heart rate noted  Respiratory: Normal respiratory effort, no problems with respiration noted  Abdomen: Soft, gravid, appropriate for gestational age.        Pelvic: Cervical exam deferred        Extremities: Normal range of motion.  Edema: None  Mental Status: Normal mood and affect. Normal behavior. Normal judgment and thought content.   Assessment and Plan:  Pregnancy: [name] at [redacted]w[redacted]d  1. Supervision of other normal pregnancy, antepartum --Discussed and offered genetic screening options, including Quad screen/AFP, NIPS testing, and option to decline testing. Benefits/risks/alternatives reviewed. Pt aware that anatomy US is form of genetic screening with lower accuracy in detecting trisomies than blood work.  Pt declines genetic screening today. --Anticipatory guidance about next  visits/weeks of pregnancy given. - Obstetric panel - HIV antibody (with reflex) - GC/Chlamydia probe amp ([place])not at [facility] - Culture, OB Urine - Cystic fibrosis diagnostic study - [place] bedside; Future  2. History of 2 cesarean sections --Hx C/S with first delivery for fetal distress, then twins with one vaginal delivery and one by C/S, then VBAC of 10 lb baby at term in 2017.  --Desires TOLAC this pregnancy. Aware that she cannot be induced.  Had membranes stripped in office last time, went into labor immediately.  Preterm labor symptoms and general obstetric precautions including but not limited to vaginal bleeding, contractions, leaking of fluid and fetal movement were reviewed in detail with the patient. Please refer to After Visit Summary for other counseling recommendations.  Return in about 4 weeks (around 04/09/2018).  No future appointments.  Sharen Counter, CNM

## 2018-03-12 NOTE — Patient Instructions (Signed)
First Trimester of Pregnancy The first trimester of pregnancy is from week 1 until the end of week 13 (months 1 through 3). A week after a sperm fertilizes an egg, the egg will implant on the wall of the uterus. This embryo will begin to develop into a baby. Genes from you and your partner will form the baby. The female genes will determine whether the baby will be a boy or a girl. At 6-8 weeks, the eyes and face will be formed, and the heartbeat can be seen on ultrasound. At the end of 12 weeks, all the baby's organs will be formed. Now that you are pregnant, you will want to do everything you can to have a healthy baby. Two of the most important things are to get good prenatal care and to follow your health care provider's instructions. Prenatal care is all the medical care you receive before the baby's birth. This care will help prevent, find, and treat any problems during the pregnancy and childbirth. Body changes during your first trimester Your body goes through many changes during pregnancy. The changes vary from woman to woman.  You may gain or lose a couple of pounds at first.  You may feel sick to your stomach (nauseous) and you may throw up (vomit). If the vomiting is uncontrollable, call your health care provider.  You may tire easily.  You may develop headaches that can be relieved by medicines. All medicines should be approved by your health care provider.  You may urinate more often. Painful urination may mean you have a bladder infection.  You may develop heartburn as a result of your pregnancy.  You may develop constipation because certain hormones are causing the muscles that push stool through your intestines to slow down.  You may develop hemorrhoids or swollen veins (varicose veins).  Your breasts may begin to grow larger and become tender. Your nipples may stick out more, and the tissue that surrounds them (areola) may become darker.  Your gums may bleed and may be  sensitive to brushing and flossing.  Dark spots or blotches (chloasma, mask of pregnancy) may develop on your face. This will likely fade after the baby is born.  Your menstrual periods will stop.  You may have a loss of appetite.  You may develop cravings for certain kinds of food.  You may have changes in your emotions from day to day, such as being excited to be pregnant or being concerned that something may go wrong with the pregnancy and baby.  You may have more vivid and strange dreams.  You may have changes in your hair. These can include thickening of your hair, rapid growth, and changes in texture. Some women also have hair loss during or after pregnancy, or hair that feels dry or thin. Your hair will most likely return to normal after your baby is born.  What to expect at prenatal visits During a routine prenatal visit:  You will be weighed to make sure you and the baby are growing normally.  Your blood pressure will be taken.  Your abdomen will be measured to track your baby's growth.  The fetal heartbeat will be listened to between weeks 10 and 14 of your pregnancy.  Test results from any previous visits will be discussed.  Your health care provider may ask you:  How you are feeling.  If you are feeling the baby move.  If you have had any abnormal symptoms, such as leaking fluid, bleeding, severe headaches,   or abdominal cramping.  If you are using any tobacco products, including cigarettes, chewing tobacco, and electronic cigarettes.  If you have any questions.  Other tests that may be performed during your first trimester include:  Blood tests to find your blood type and to check for the presence of any previous infections. The tests will also be used to check for low iron levels (anemia) and protein on red blood cells (Rh antibodies). Depending on your risk factors, or if you previously had diabetes during pregnancy, you may have tests to check for high blood  sugar that affects pregnant women (gestational diabetes).  Urine tests to check for infections, diabetes, or protein in the urine.  An ultrasound to confirm the proper growth and development of the baby.  Fetal screens for spinal cord problems (spina bifida) and Down syndrome.  HIV (human immunodeficiency virus) testing. Routine prenatal testing includes screening for HIV, unless you choose not to have this test.  You may need other tests to make sure you and the baby are doing well.  Follow these instructions at home: Medicines  Follow your health care provider's instructions regarding medicine use. Specific medicines may be either safe or unsafe to take during pregnancy.  Take a prenatal vitamin that contains at least 600 micrograms (mcg) of folic acid.  If you develop constipation, try taking a stool softener if your health care provider approves. Eating and drinking  Eat a balanced diet that includes fresh fruits and vegetables, whole grains, good sources of protein such as meat, eggs, or tofu, and low-fat dairy. Your health care provider will help you determine the amount of weight gain that is right for you.  Avoid raw meat and uncooked cheese. These carry germs that can cause birth defects in the baby.  Eating four or five small meals rather than three large meals a day may help relieve nausea and vomiting. If you start to feel nauseous, eating a few soda crackers can be helpful. Drinking liquids between meals, instead of during meals, also seems to help ease nausea and vomiting.  Limit foods that are high in fat and processed sugars, such as fried and sweet foods.  To prevent constipation: ? Eat foods that are high in fiber, such as fresh fruits and vegetables, whole grains, and beans. ? Drink enough fluid to keep your urine clear or pale yellow. Activity  Exercise only as directed by your health care provider. Most women can continue their usual exercise routine during  pregnancy. Try to exercise for 30 minutes at least 5 days a week. Exercising will help you: ? Control your weight. ? Stay in shape. ? Be prepared for labor and delivery.  Experiencing pain or cramping in the lower abdomen or lower back is a good sign that you should stop exercising. Check with your health care provider before continuing with normal exercises.  Try to avoid standing for long periods of time. Move your legs often if you must stand in one place for a long time.  Avoid heavy lifting.  Wear low-heeled shoes and practice good posture.  You may continue to have sex unless your health care provider tells you not to. Relieving pain and discomfort  Wear a good support bra to relieve breast tenderness.  Take warm sitz baths to soothe any pain or discomfort caused by hemorrhoids. Use hemorrhoid cream if your health care provider approves.  Rest with your legs elevated if you have leg cramps or low back pain.  If you develop   varicose veins in your legs, wear support hose. Elevate your feet for 15 minutes, 3-4 times a day. Limit salt in your diet. Prenatal care  Schedule your prenatal visits by the twelfth week of pregnancy. They are usually scheduled monthly at first, then more often in the last 2 months before delivery.  Write down your questions. Take them to your prenatal visits.  Keep all your prenatal visits as told by your health care provider. This is important. Safety  Wear your seat belt at all times when driving.  Make a list of emergency phone numbers, including numbers for family, friends, the hospital, and police and fire departments. General instructions  Ask your health care provider for a referral to a local prenatal education class. Begin classes no later than the beginning of month 6 of your pregnancy.  Ask for help if you have counseling or nutritional needs during pregnancy. Your health care provider can offer advice or refer you to specialists for help  with various needs.  Do not use hot tubs, steam rooms, or saunas.  Do not douche or use tampons or scented sanitary pads.  Do not cross your legs for long periods of time.  Avoid cat litter boxes and soil used by cats. These carry germs that can cause birth defects in the baby and possibly loss of the fetus by miscarriage or stillbirth.  Avoid all smoking, herbs, alcohol, and medicines not prescribed by your health care provider. Chemicals in these products affect the formation and growth of the baby.  Do not use any products that contain nicotine or tobacco, such as cigarettes and e-cigarettes. If you need help quitting, ask your health care provider. You may receive counseling support and other resources to help you quit.  Schedule a dentist appointment. At home, brush your teeth with a soft toothbrush and be gentle when you floss. Contact a health care provider if:  You have dizziness.  You have mild pelvic cramps, pelvic pressure, or nagging pain in the abdominal area.  You have persistent nausea, vomiting, or diarrhea.  You have a bad smelling vaginal discharge.  You have pain when you urinate.  You notice increased swelling in your face, hands, legs, or ankles.  You are exposed to fifth disease or chickenpox.  You are exposed to German measles (rubella) and have never had it. Get help right away if:  You have a fever.  You are leaking fluid from your vagina.  You have spotting or bleeding from your vagina.  You have severe abdominal cramping or pain.  You have rapid weight gain or loss.  You vomit blood or material that looks like coffee grounds.  You develop a severe headache.  You have shortness of breath.  You have any kind of trauma, such as from a fall or a car accident. Summary  The first trimester of pregnancy is from week 1 until the end of week 13 (months 1 through 3).  Your body goes through many changes during pregnancy. The changes vary from  woman to woman.  You will have routine prenatal visits. During those visits, your health care provider will examine you, discuss any test results you may have, and talk with you about how you are feeling. This information is not intended to replace advice given to you by your health care provider. Make sure you discuss any questions you have with your health care provider. Document Released: 05/20/2001 Document Revised: 05/07/2016 Document Reviewed: 05/07/2016 Elsevier Interactive Patient Education  2018 Elsevier   [employer]   Second Trimester of Pregnancy The second trimester is from week 14 through week 27 (months 4 through 6). The second trimester is often a time when you feel your best. Your body has adjusted to being pregnant, and you begin to feel better physically. Usually, morning sickness has lessened or quit completely, you may have more energy, and you may have an increase in appetite. The second trimester is also a time when the fetus is growing rapidly. At the end of the sixth month, the fetus is about 9 inches long and weighs about 1 pounds. You will likely begin to feel the baby move (quickening) between 16 and 20 weeks of pregnancy. Body changes during your second trimester Your body continues to go through many changes during your second trimester. The changes vary from woman to woman.  Your weight will continue to increase. You will notice your lower abdomen bulging out.  You may begin to get stretch marks on your hips, abdomen, and breasts.  You may develop headaches that can be relieved by medicines. The medicines should be approved by your health care provider.  You may urinate more often because the fetus is pressing on your bladder.  You may develop or continue to have heartburn as a result of your pregnancy.  You may develop constipation because certain hormones are causing the muscles that push waste through your intestines to slow down.  You may develop hemorrhoids or  swollen, bulging veins (varicose veins).  You may have back pain. This is caused by: ? Weight gain. ? Pregnancy hormones that are relaxing the joints in your pelvis. ? A shift in weight and the muscles that support your balance.  Your breasts will continue to grow and they will continue to become tender.  Your gums may bleed and may be sensitive to brushing and flossing.  Dark spots or blotches (chloasma, mask of pregnancy) may develop on your face. This will likely fade after the baby is born.  A dark line from your belly button to the pubic area (linea nigra) may appear. This will likely fade after the baby is born.  You may have changes in your hair. These can include thickening of your hair, rapid growth, and changes in texture. Some women also have hair loss during or after pregnancy, or hair that feels dry or thin. Your hair will most likely return to normal after your baby is born.  What to expect at prenatal visits During a routine prenatal visit:  You will be weighed to make sure you and the fetus are growing normally.  Your blood pressure will be taken.  Your abdomen will be measured to track your baby's growth.  The fetal heartbeat will be listened to.  Any test results from the previous visit will be discussed.  Your health care provider may ask you:  How you are feeling.  If you are feeling the baby move.  If you have had any abnormal symptoms, such as leaking fluid, bleeding, severe headaches, or abdominal cramping.  If you are using any tobacco products, including cigarettes, chewing tobacco, and electronic cigarettes.  If you have any questions.  Other tests that may be performed during your second trimester include:  Blood tests that check for: ? Low iron levels (anemia). ? High blood sugar that affects pregnant women (gestational diabetes) between 24 and 28 weeks. ? Rh antibodies. This is to check for a protein on red blood cells (Rh factor).  Urine  tests to   for infections, diabetes, or protein in the urine.  An ultrasound to confirm the proper growth and development of the baby.  An amniocentesis to check for possible genetic problems.  Fetal screens for spina bifida and Down syndrome.  HIV (human immunodeficiency virus) testing. Routine prenatal testing includes screening for HIV, unless you choose not to have this test.  Follow these instructions at home: Medicines  Follow your health care provider's instructions regarding medicine use. Specific medicines may be either safe or unsafe to take during pregnancy.  Take a prenatal vitamin that contains at least 600 micrograms (mcg) of folic acid.  If you develop constipation, try taking a stool softener if your health care provider approves. Eating and drinking  Eat a balanced diet that includes fresh fruits and vegetables, whole grains, good sources of protein such as meat, eggs, or tofu, and low-fat dairy. Your health care provider will help you determine the amount of weight gain that is right for you.  Avoid raw meat and uncooked cheese. These carry germs that can cause birth defects in the baby.  If you have low calcium intake from food, talk to your health care provider about whether you should take a daily calcium supplement.  Limit foods that are high in fat and processed sugars, such as fried and sweet foods.  To prevent constipation: ? Drink enough fluid to keep your urine clear or pale yellow. ? Eat foods that are high in fiber, such as fresh fruits and vegetables, whole grains, and beans. Activity  Exercise only as directed by your health care provider. Most women can continue their usual exercise routine during pregnancy. Try to exercise for 30 minutes at least 5 days a week. Stop exercising if you experience uterine contractions.  Avoid heavy lifting, wear low heel shoes, and practice good posture.  A sexual relationship may be continued unless your health  care provider directs you otherwise. Relieving pain and discomfort  Wear a good support bra to prevent discomfort from breast tenderness.  Take warm sitz baths to soothe any pain or discomfort caused by hemorrhoids. Use hemorrhoid cream if your health care provider approves.  Rest with your legs elevated if you have leg cramps or low back pain.  If you develop varicose veins, wear support hose. Elevate your feet for 15 minutes, 3-4 times a day. Limit salt in your diet. Prenatal Care  Write down your questions. Take them to your prenatal visits.  Keep all your prenatal visits as told by your health care provider. This is important. Safety  Wear your seat belt at all times when driving.  Make a list of emergency phone numbers, including numbers for family, friends, the hospital, and police and fire departments. General instructions  Ask your health care provider for a referral to a local prenatal education class. Begin classes no later than the beginning of month 6 of your pregnancy.  Ask for help if you have counseling or nutritional needs during pregnancy. Your health care provider can offer advice or refer you to specialists for help with various needs.  Do not use hot tubs, steam rooms, or saunas.  Do not douche or use tampons or scented sanitary pads.  Do not cross your legs for long periods of time.  Avoid cat litter boxes and soil used by cats. These carry germs that can cause birth defects in the baby and possibly loss of the fetus by miscarriage or stillbirth.  Avoid all smoking, herbs, alcohol, and unprescribed drugs. Chemicals in  in these products can affect the formation and growth of the baby.  Do not use any products that contain nicotine or tobacco, such as cigarettes and e-cigarettes. If you need help quitting, ask your health care provider.  Visit your dentist if you have not gone yet during your pregnancy. Use a soft toothbrush to brush your teeth and be gentle when  you floss. Contact a health care provider if:  You have dizziness.  You have mild pelvic cramps, pelvic pressure, or nagging pain in the abdominal area.  You have persistent nausea, vomiting, or diarrhea.  You have a bad smelling vaginal discharge.  You have pain when you urinate. Get help right away if:  You have a fever.  You are leaking fluid from your vagina.  You have spotting or bleeding from your vagina.  You have severe abdominal cramping or pain.  You have rapid weight gain or weight loss.  You have shortness of breath with chest pain.  You notice sudden or extreme swelling of your face, hands, ankles, feet, or legs.  You have not felt your baby move in over an hour.  You have severe headaches that do not go away when you take medicine.  You have vision changes. Summary  The second trimester is from week 14 through week 27 (months 4 through 6). It is also a time when the fetus is growing rapidly.  Your body goes through many changes during pregnancy. The changes vary from woman to woman.  Avoid all smoking, herbs, alcohol, and unprescribed drugs. These chemicals affect the formation and growth your baby.  Do not use any tobacco products, such as cigarettes, chewing tobacco, and e-cigarettes. If you need help quitting, ask your health care provider.  Contact your health care provider if you have any questions. Keep all prenatal visits as told by your health care provider. This is important. This information is not intended to replace advice given to you by your health care provider. Make sure you discuss any questions you have with your health care provider. Document Released: 05/20/2001 Document Revised: 07/01/2016 Document Reviewed: 07/01/2016 Elsevier Interactive Patient Education  2018 [employer]  

## 2018-03-15 LAB — GC/CHLAMYDIA PROBE AMP (~~LOC~~) NOT AT ARMC
Chlamydia: NEGATIVE
Neisseria Gonorrhea: NEGATIVE

## 2018-03-16 LAB — URINE CULTURE, OB REFLEX

## 2018-03-16 LAB — CULTURE, OB URINE

## 2018-03-23 LAB — OBSTETRIC PANEL
ANTIBODY SCREEN: NOT DETECTED
BASOS PCT: 0.3 %
Basophils Absolute: 20 cells/uL (ref 0–200)
EOS PCT: 0.9 %
Eosinophils Absolute: 59 cells/uL (ref 15–500)
HEMATOCRIT: 42.3 % (ref 35.0–45.0)
HEP B S AG: NONREACTIVE
Hemoglobin: 14 g/dL (ref 11.7–15.5)
LYMPHS ABS: 1216 {cells}/uL (ref 850–3900)
MCH: 32.1 pg (ref 27.0–33.0)
MCHC: 33.1 g/dL (ref 32.0–36.0)
MCV: 97 fL (ref 80.0–100.0)
MPV: 10.3 fL (ref 7.5–12.5)
Monocytes Relative: 8.4 %
NEUTROS PCT: 71.7 %
Neutro Abs: 4661 cells/uL (ref 1500–7800)
PLATELETS: 221 10*3/uL (ref 140–400)
RBC: 4.36 10*6/uL (ref 3.80–5.10)
RDW: 11.7 % (ref 11.0–15.0)
RPR Ser Ql: NONREACTIVE
Rubella: 3.67 index
Total Lymphocyte: 18.7 %
WBC: 6.5 10*3/uL (ref 3.8–10.8)
WBCMIX: 546 {cells}/uL (ref 200–950)

## 2018-03-23 LAB — CYSTIC FIBROSIS DIAGNOSTIC STUDY

## 2018-03-23 LAB — HIV ANTIBODY (ROUTINE TESTING W REFLEX): HIV 1&2 Ab, 4th Generation: NONREACTIVE

## 2018-03-30 NOTE — Addendum Note (Signed)
Addended by: Kathie Dike on: 03/30/2018 01:57 PM   Modules accepted: Orders

## 2018-04-09 ENCOUNTER — Ambulatory Visit (INDEPENDENT_AMBULATORY_CARE_PROVIDER_SITE_OTHER): Payer: Medicaid Other | Admitting: Certified Nurse Midwife

## 2018-04-09 DIAGNOSIS — Z3480 Encounter for supervision of other normal pregnancy, unspecified trimester: Secondary | ICD-10-CM

## 2018-04-09 NOTE — Patient Instructions (Signed)
Second Trimester of Pregnancy The second trimester is from week 13 through week 28, month 4 through 6. This is often the time in pregnancy that you feel your best. Often times, morning sickness has lessened or quit. You may have more energy, and you may get hungry more often. Your unborn baby (fetus) is growing rapidly. At the end of the sixth month, he or she is about 9 inches long and weighs about 1 pounds. You will likely feel the baby move (quickening) between 18 and 20 weeks of pregnancy. Follow these instructions at home:  Avoid all smoking, herbs, and alcohol. Avoid drugs not approved by your doctor.  Do not use any tobacco products, including cigarettes, chewing tobacco, and electronic cigarettes. If you need help quitting, ask your doctor. You may get counseling or other support to help you quit.  Only take medicine as told by your doctor. Some medicines are safe and some are not during pregnancy.  Exercise only as told by your doctor. Stop exercising if you start having cramps.  Eat regular, healthy meals.  Wear a good support bra if your breasts are tender.  Do not use hot tubs, steam rooms, or saunas.  Wear your seat belt when driving.  Avoid raw meat, uncooked cheese, and liter boxes and soil used by cats.  Take your prenatal vitamins.  Take 1500-2000 milligrams of calcium daily starting at the 20th week of pregnancy until you deliver your baby.  Try taking medicine that helps you poop (stool softener) as needed, and if your doctor approves. Eat more fiber by eating fresh fruit, vegetables, and whole grains. Drink enough fluids to keep your pee (urine) clear or pale yellow.  Take warm water baths (sitz baths) to soothe pain or discomfort caused by hemorrhoids. Use hemorrhoid cream if your doctor approves.  If you have puffy, bulging veins (varicose veins), wear support hose. Raise (elevate) your feet for 15 minutes, 3-4 times a day. Limit salt in your diet.  Avoid heavy  lifting, wear low heals, and sit up straight.  Rest with your legs raised if you have leg cramps or low back pain.  Visit your dentist if you have not gone during your pregnancy. Use a soft toothbrush to brush your teeth. Be gentle when you floss.  You can have sex (intercourse) unless your doctor tells you not to.  Go to your doctor visits. Get help if:  You feel dizzy.  You have mild cramps or pressure in your lower belly (abdomen).  You have a nagging pain in your belly area.  You continue to feel sick to your stomach (nauseous), throw up (vomit), or have watery poop (diarrhea).  You have bad smelling fluid coming from your vagina.  You have pain with peeing (urination). Get help right away if:  You have a fever.  You are leaking fluid from your vagina.  You have spotting or bleeding from your vagina.  You have severe belly cramping or pain.  You lose or gain weight rapidly.  You have trouble catching your breath and have chest pain.  You notice sudden or extreme puffiness (swelling) of your face, hands, ankles, feet, or legs.  You have not felt the baby move in over an hour.  You have severe headaches that do not go away with medicine.  You have vision changes. This information is not intended to replace advice given to you by your health care provider. Make sure you discuss any questions you have with your health care   provider. Document Released: 08/20/2009 Document Revised: 11/01/2015 Document Reviewed: 07/27/2012 Elsevier Interactive Patient Education  2017 Elsevier Inc.  

## 2018-04-09 NOTE — Progress Notes (Signed)
Subjective:  Jillian Mcguire is a 37 y.o. [id number] at [redacted]w[redacted]d being seen today for ongoing prenatal care.  She is currently monitored for the following issues for this low-risk pregnancy and has Previous cesarean delivery affecting pregnancy, antepartum; PCO (polycystic ovaries); History of preterm delivery, currently pregnant in first trimester; History of shoulder dystocia in prior pregnancy; and Supervision of other normal pregnancy, antepartum on their problem list.  Patient reports no complaints.  Contractions: Not present. Vag. Bleeding: None.  Movement: Absent. Denies leaking of fluid.   The following portions of the patient's history were reviewed and updated as appropriate: allergies, current medications, past family history, past medical history, past social history, past surgical history and problem list. Problem list updated.  Objective:   Vitals:   04/09/18 0911  BP: 92/61  Pulse: 77  Weight: 59.9 kg    Fetal Status: Fetal Heart Rate (bpm): 148 Fundal Height: 14 cm Movement: Absent     General:  Alert, oriented and cooperative. Patient is in no acute distress.  Skin: Skin is warm and dry. No rash noted.   Cardiovascular: Normal heart rate noted  Respiratory: Normal respiratory effort, no problems with respiration noted  Abdomen: Soft, gravid, appropriate for gestational age. Pain/Pressure: Absent     Pelvic: Vag. Bleeding: None Vag D/C Character: Thin   Cervical exam deferred        Extremities: Normal range of motion.  Edema: None  Mental Status: Normal mood and affect. Normal behavior. Normal judgment and thought content.   Urinalysis:      Assessment and Plan:  Pregnancy: [id number] at [redacted]w[redacted]d  1. Supervision of other normal pregnancy, antepartum - [place] MFM OB COMP + 14 WK; Future  Preterm labor symptoms and general obstetric precautions including but not limited to vaginal bleeding, contractions, leaking of fluid and fetal movement were reviewed in detail with the  patient. Please refer to After Visit Summary for other counseling recommendations.  Return in about 6 weeks (around 05/21/2018).   Donette Larry, CNM

## 2018-05-05 ENCOUNTER — Encounter (HOSPITAL_COMMUNITY): Payer: Self-pay

## 2018-05-13 ENCOUNTER — Ambulatory Visit (HOSPITAL_COMMUNITY)
Admission: RE | Admit: 2018-05-13 | Discharge: 2018-05-13 | Disposition: A | Discharge status: Home / Self Care | Payer: Medicaid Other | Source: Ambulatory Visit | Attending: Certified Nurse Midwife | Admitting: Certified Nurse Midwife

## 2018-05-13 ENCOUNTER — Other Ambulatory Visit (HOSPITAL_COMMUNITY): Payer: Self-pay | Admitting: *Deleted

## 2018-05-13 ENCOUNTER — Other Ambulatory Visit: Payer: Self-pay | Admitting: Certified Nurse Midwife

## 2018-05-13 DIAGNOSIS — O09522 Supervision of elderly multigravida, second trimester: Secondary | ICD-10-CM | POA: Insufficient documentation

## 2018-05-13 DIAGNOSIS — O34219 Maternal care for unspecified type scar from previous cesarean delivery: Secondary | ICD-10-CM | POA: Insufficient documentation

## 2018-05-13 DIAGNOSIS — Z3480 Encounter for supervision of other normal pregnancy, unspecified trimester: Secondary | ICD-10-CM

## 2018-05-13 DIAGNOSIS — Z363 Encounter for antenatal screening for malformations: Secondary | ICD-10-CM | POA: Diagnosis present

## 2018-05-13 DIAGNOSIS — Z3A18 18 weeks gestation of pregnancy: Secondary | ICD-10-CM

## 2018-05-13 DIAGNOSIS — O09292 Supervision of pregnancy with other poor reproductive or obstetric history, second trimester: Secondary | ICD-10-CM | POA: Insufficient documentation

## 2018-05-13 DIAGNOSIS — O09529 Supervision of elderly multigravida, unspecified trimester: Secondary | ICD-10-CM

## 2018-05-21 ENCOUNTER — Ambulatory Visit (INDEPENDENT_AMBULATORY_CARE_PROVIDER_SITE_OTHER): Payer: Medicaid Other

## 2018-05-21 VITALS — BP 103/66 | HR 84 | Wt 139.0 lb

## 2018-05-21 DIAGNOSIS — Z3482 Encounter for supervision of other normal pregnancy, second trimester: Secondary | ICD-10-CM

## 2018-05-21 DIAGNOSIS — Z98891 History of uterine scar from previous surgery: Secondary | ICD-10-CM

## 2018-05-21 DIAGNOSIS — Z3480 Encounter for supervision of other normal pregnancy, unspecified trimester: Secondary | ICD-10-CM

## 2018-05-21 NOTE — Progress Notes (Signed)
   PRENATAL VISIT NOTE  Subjective:  Jillian Mcguire is a 37 y.o. [id number] at 9350w2d being seen today for ongoing prenatal care.  She is currently monitored for the following issues for this low-risk pregnancy and has Previous cesarean delivery affecting pregnancy, antepartum; PCO (polycystic ovaries); History of preterm delivery, currently pregnant in first trimester; History of shoulder dystocia in prior pregnancy; and Supervision of other normal pregnancy, antepartum on their problem list.  Patient reports no complaints.  Contractions: Not present. Vag. Bleeding: None.  Movement: Present. Denies leaking of fluid.   The following portions of the patient's history were reviewed and updated as appropriate: allergies, current medications, past family history, past medical history, past social history, past surgical history and problem list. Problem list updated.  Objective:   Vitals:   05/21/18 0903  BP: 103/66  Pulse: 84  Weight: 139 lb (63 kg)    Fetal Status: Fetal Heart Rate (bpm): 151   Movement: Present     General:  Alert, oriented and cooperative. Patient is in no acute distress.  Skin: Skin is warm and dry. No rash noted.   Cardiovascular: Normal heart rate noted  Respiratory: Normal respiratory effort, no problems with respiration noted  Abdomen: Soft, gravid, appropriate for gestational age.  Pain/Pressure: Absent     Pelvic: Cervical exam deferred        Extremities: Normal range of motion.  Edema: None  Mental Status: Normal mood and affect. Normal behavior. Normal judgment and thought content.   Assessment and Plan:  Pregnancy: [id number] at 7350w2d  1. Supervision of other normal pregnancy, antepartum -No complaints, routine care -Incomplete anatomy, missing views of heart and profile, follow up scheduled 4 weeks -GTT and labs next visit. Reviewed 2hr GTT  2. History of 2 cesarean sections - Planning TOLAC, consent next visit  Preterm labor symptoms and general  obstetric precautions including but not limited to vaginal bleeding, contractions, leaking of fluid and fetal movement were reviewed in detail with the patient. Please refer to After Visit Summary for other counseling recommendations.  Return in about 8 weeks (around 07/16/2018) for Return OB visit, 2hr GTT and labs.  Future Appointments  Date Time Provider Department Center  06/11/2018  8:45 AM [facility] US 2 [facility]    Rolm BookbinderCaroline M Carren Blakley, CNM  05/21/18 9:25 AM

## 2018-05-21 NOTE — Patient Instructions (Signed)

## 2018-06-09 NOTE — L&D Delivery Note (Signed)
   Delivery Note Pt labored down for a short while, felt some pressure and baby was noted to be at +3.  After a 3 contraction 2nd stage, at 2:08 PM a viable female was delivered via Vaginal, Spontaneous (Presentation:OA, double nuchal cord, unable to reduce. Delivered through, somersaulting baby towards mom's thighs and unwinding as soon as feet delivered  ).  APGAR: 9, 9; weight pending.  After 1 minute, the cord was clamped and cut. 40 units of pitocin diluted in 1000cc LR was infused rapidly IV.  The placenta separated spontaneously and delivered via CCT and maternal pushing effort.  It was inspected and appears to be intact with a 3 VC .    Anesthesia:   Episiotomy: None Lacerations: 1st degree Suture Repair: 2.0 vicryl Est. Blood Loss (mL): 350  Mom to postpartum.  Baby to Couplet care / Skin to Skin.  Jillian Mcguire 10/07/2018, 2:48 PM  .

## 2018-06-11 ENCOUNTER — Ambulatory Visit (HOSPITAL_COMMUNITY)
Admission: RE | Admit: 2018-06-11 | Discharge: 2018-06-11 | Disposition: A | Discharge status: Home / Self Care | Payer: Medicaid Other | Source: Ambulatory Visit | Attending: Certified Nurse Midwife | Admitting: Certified Nurse Midwife

## 2018-06-11 DIAGNOSIS — O09292 Supervision of pregnancy with other poor reproductive or obstetric history, second trimester: Secondary | ICD-10-CM | POA: Diagnosis not present

## 2018-06-11 DIAGNOSIS — O09529 Supervision of elderly multigravida, unspecified trimester: Secondary | ICD-10-CM | POA: Diagnosis not present

## 2018-06-11 DIAGNOSIS — O09522 Supervision of elderly multigravida, second trimester: Secondary | ICD-10-CM

## 2018-06-11 DIAGNOSIS — Z3A23 23 weeks gestation of pregnancy: Secondary | ICD-10-CM | POA: Diagnosis not present

## 2018-06-11 DIAGNOSIS — Z362 Encounter for other antenatal screening follow-up: Secondary | ICD-10-CM | POA: Diagnosis not present

## 2018-07-16 ENCOUNTER — Ambulatory Visit (INDEPENDENT_AMBULATORY_CARE_PROVIDER_SITE_OTHER): Payer: Medicaid Other | Admitting: Certified Nurse Midwife

## 2018-07-16 VITALS — BP 99/59 | HR 79 | Wt 147.0 lb

## 2018-07-16 DIAGNOSIS — Z3480 Encounter for supervision of other normal pregnancy, unspecified trimester: Secondary | ICD-10-CM

## 2018-07-16 DIAGNOSIS — O26843 Uterine size-date discrepancy, third trimester: Secondary | ICD-10-CM

## 2018-07-16 DIAGNOSIS — Z3483 Encounter for supervision of other normal pregnancy, third trimester: Secondary | ICD-10-CM | POA: Diagnosis not present

## 2018-07-16 DIAGNOSIS — O34219 Maternal care for unspecified type scar from previous cesarean delivery: Secondary | ICD-10-CM

## 2018-07-16 DIAGNOSIS — Z8759 Personal history of other complications of pregnancy, childbirth and the puerperium: Secondary | ICD-10-CM

## 2018-07-16 MED ORDER — MM EASY TOUCH GLUCOSE METER W/DEVICE KIT: 1.0000 [IU] | PACK | Freq: Once | 0 refills | Status: AC

## 2018-07-16 NOTE — Progress Notes (Signed)
Subjective:  Jillian Mcguire is a 38 y.o. [id number] at 7748w2d being seen today for ongoing prenatal care.  She is currently monitored for the following issues for this high-risk pregnancy and has Previous cesarean delivery affecting pregnancy, antepartum; PCO (polycystic ovaries); History of preterm delivery, currently pregnant in first trimester; History of shoulder dystocia in prior pregnancy; and Supervision of other normal pregnancy, antepartum on their problem list.  Patient reports no complaints.  Contractions: Not present. Vag. Bleeding: None.  Movement: Present. Denies leaking of fluid.   The following portions of the patient's history were reviewed and updated as appropriate: allergies, current medications, past family history, past medical history, past social history, past surgical history and problem list. Problem list updated.  Objective:   Vitals:   07/16/18 0829  BP: (!) 99/59  Pulse: 79  Weight: 66.7 kg    Fetal Status: Fetal Heart Rate (bpm): 148 Fundal Height: 31 cm Movement: Present  Presentation: Vertex  General:  Alert, oriented and cooperative. Patient is in no acute distress.  Skin: Skin is warm and dry. No rash noted.   Cardiovascular: Normal heart rate noted  Respiratory: Normal respiratory effort, no problems with respiration noted  Abdomen: Soft, gravid, appropriate for gestational age. Pain/Pressure: Absent     Pelvic: Vag. Bleeding: None Vag D/C Character: Thin   Cervical exam deferred        Extremities: Normal range of motion.  Edema: None  Mental Status: Normal mood and affect. Normal behavior. Normal judgment and thought content.   Urinalysis:      Assessment and Plan:  Pregnancy: [id number] at 4648w2d  1. Supervision of other normal pregnancy, antepartum - HIV antibody (with reflex) - RPR - CBC - declines GTT> plan for 2 weeks home glucose monitoring, check FBS and 2 hrs pp, send results after 1 week through mychart  2. Significant discrepancy  between uterine size and clinical dates, antepartum, third trimester - [place] MFM OB FOLLOW UP; Future  3. Previous cesarean delivery affecting pregnancy, antepartum - planning TOLAC - consent signed  4. History of shoulder dystocia in prior pregnancy - baby was 10 lbs, no injury - offered CS, desires vaginal delivery  Preterm labor symptoms and general obstetric precautions including but not limited to vaginal bleeding, contractions, leaking of fluid and fetal movement were reviewed in detail with the patient. Please refer to After Visit Summary for other counseling recommendations.  Return in about 4 weeks (around 08/13/2018).   Donette LarryBhambri, Jasyah Theurer, CNM

## 2018-07-16 NOTE — Patient Instructions (Signed)

## 2018-07-18 ENCOUNTER — Encounter: Payer: Self-pay | Admitting: Certified Nurse Midwife

## 2018-07-19 ENCOUNTER — Telehealth: Payer: Self-pay | Admitting: *Deleted

## 2018-07-19 LAB — CBC
HCT: 32.9 % — ABNORMAL LOW (ref 35.0–45.0)
Hemoglobin: 11.4 g/dL — ABNORMAL LOW (ref 11.7–15.5)
MCH: 32.9 pg (ref 27.0–33.0)
MCHC: 34.7 g/dL (ref 32.0–36.0)
MCV: 94.8 fL (ref 80.0–100.0)
MPV: 11.1 fL (ref 7.5–12.5)
PLATELETS: 214 10*3/uL (ref 140–400)
RBC: 3.47 10*6/uL — AB (ref 3.80–5.10)
RDW: 11.8 % (ref 11.0–15.0)
WBC: 9.3 10*3/uL (ref 3.8–10.8)

## 2018-07-19 LAB — HIV ANTIBODY (ROUTINE TESTING W REFLEX): HIV: NONREACTIVE

## 2018-07-19 LAB — HEMOGLOBIN A1C
EAG (MMOL/L): 5.5 (calc)
Hgb A1c MFr Bld: 5.1 % of total Hgb (ref <5.7)
MEAN PLASMA GLUCOSE: 100 (calc)

## 2018-07-19 LAB — RPR: RPR Ser Ql: NONREACTIVE

## 2018-07-19 MED ORDER — GLUCOSE BLOOD VI STRP: ORAL_STRIP | 12 refills | Status: DC

## 2018-07-19 MED ORDER — ACCU-CHEK FASTCLIX LANCETS MISC: 6 refills | Status: DC

## 2018-07-19 NOTE — Telephone Encounter (Signed)
Pt called requesting her lancets and strips for her Accu chek Guide meter be sent to [facility] in [place].

## 2018-07-29 ENCOUNTER — Ambulatory Visit (HOSPITAL_COMMUNITY)
Admission: RE | Admit: 2018-07-29 | Discharge: 2018-07-29 | Disposition: A | Discharge status: Home / Self Care | Payer: Medicaid Other | Source: Ambulatory Visit | Attending: Certified Nurse Midwife | Admitting: Certified Nurse Midwife

## 2018-07-29 DIAGNOSIS — O09293 Supervision of pregnancy with other poor reproductive or obstetric history, third trimester: Secondary | ICD-10-CM

## 2018-07-29 DIAGNOSIS — Z361 Encounter for antenatal screening for raised alphafetoprotein level: Secondary | ICD-10-CM

## 2018-07-29 DIAGNOSIS — O409XX0 Polyhydramnios, unspecified trimester, not applicable or unspecified: Secondary | ICD-10-CM | POA: Insufficient documentation

## 2018-07-29 DIAGNOSIS — O26849 Uterine size-date discrepancy, unspecified trimester: Secondary | ICD-10-CM | POA: Diagnosis present

## 2018-07-29 DIAGNOSIS — O26843 Uterine size-date discrepancy, third trimester: Secondary | ICD-10-CM | POA: Diagnosis not present

## 2018-07-29 DIAGNOSIS — Z3A29 29 weeks gestation of pregnancy: Secondary | ICD-10-CM

## 2018-07-29 DIAGNOSIS — O34219 Maternal care for unspecified type scar from previous cesarean delivery: Secondary | ICD-10-CM | POA: Diagnosis not present

## 2018-07-30 ENCOUNTER — Ambulatory Visit (HOSPITAL_COMMUNITY): Admission: RE | Admit: 2018-07-30 | Payer: Medicaid Other | Source: Ambulatory Visit

## 2018-07-30 ENCOUNTER — Other Ambulatory Visit (HOSPITAL_COMMUNITY): Payer: Self-pay | Admitting: *Deleted

## 2018-07-30 DIAGNOSIS — O409XX0 Polyhydramnios, unspecified trimester, not applicable or unspecified: Secondary | ICD-10-CM | POA: Insufficient documentation

## 2018-07-30 DIAGNOSIS — O4190X0 Disorder of amniotic fluid and membranes, unspecified, unspecified trimester, not applicable or unspecified: Secondary | ICD-10-CM

## 2018-07-30 DIAGNOSIS — O26849 Uterine size-date discrepancy, unspecified trimester: Secondary | ICD-10-CM | POA: Insufficient documentation

## 2018-08-01 ENCOUNTER — Encounter: Payer: Self-pay | Admitting: Certified Nurse Midwife

## 2018-08-13 ENCOUNTER — Ambulatory Visit (INDEPENDENT_AMBULATORY_CARE_PROVIDER_SITE_OTHER): Payer: Medicaid Other | Admitting: Certified Nurse Midwife

## 2018-08-13 ENCOUNTER — Ambulatory Visit (HOSPITAL_COMMUNITY): Payer: Medicaid Other | Admitting: *Deleted

## 2018-08-13 ENCOUNTER — Ambulatory Visit (HOSPITAL_COMMUNITY)
Admission: RE | Admit: 2018-08-13 | Discharge: 2018-08-13 | Disposition: A | Discharge status: Home / Self Care | Payer: Medicaid Other | Source: Ambulatory Visit | Attending: Obstetrics and Gynecology | Admitting: Obstetrics and Gynecology

## 2018-08-13 ENCOUNTER — Encounter (HOSPITAL_COMMUNITY): Payer: Self-pay

## 2018-08-13 VITALS — BP 102/59 | HR 80 | Wt 154.0 lb

## 2018-08-13 VITALS — BP 124/70 | HR 108 | Wt 152.0 lb

## 2018-08-13 DIAGNOSIS — Z3A32 32 weeks gestation of pregnancy: Secondary | ICD-10-CM

## 2018-08-13 DIAGNOSIS — O26843 Uterine size-date discrepancy, third trimester: Secondary | ICD-10-CM

## 2018-08-13 DIAGNOSIS — O34219 Maternal care for unspecified type scar from previous cesarean delivery: Secondary | ICD-10-CM

## 2018-08-13 DIAGNOSIS — O4190X0 Disorder of amniotic fluid and membranes, unspecified, unspecified trimester, not applicable or unspecified: Secondary | ICD-10-CM | POA: Diagnosis present

## 2018-08-13 DIAGNOSIS — O09293 Supervision of pregnancy with other poor reproductive or obstetric history, third trimester: Secondary | ICD-10-CM

## 2018-08-13 DIAGNOSIS — O0990 Supervision of high risk pregnancy, unspecified, unspecified trimester: Secondary | ICD-10-CM | POA: Insufficient documentation

## 2018-08-13 DIAGNOSIS — R102 Pelvic and perineal pain: Secondary | ICD-10-CM

## 2018-08-13 DIAGNOSIS — Z3480 Encounter for supervision of other normal pregnancy, unspecified trimester: Secondary | ICD-10-CM

## 2018-08-13 DIAGNOSIS — O26893 Other specified pregnancy related conditions, third trimester: Secondary | ICD-10-CM

## 2018-08-13 NOTE — Progress Notes (Addendum)
   PRENATAL VISIT NOTE  Subjective:  Jillian Mcguire is a 38 y.o. [phone] at [redacted]w[redacted]d being seen today for ongoing prenatal care.  She is currently monitored for the following issues for this high-risk pregnancy and has Previous cesarean delivery affecting pregnancy, antepartum; PCO (polycystic ovaries); History of preterm delivery, currently pregnant in first trimester; History of shoulder dystocia in prior pregnancy; Supervision of other normal pregnancy, antepartum; Polyhydramnios affecting pregnancy; and Significant discrepancy between uterine size and clinical dates, antepartum on their problem list.  Patient reports occasional contractions. Reports intermittent sharp vaginal pain with pressure that makes her feel the need to void x1 week. Denies dysuria, urgency, and hematuria. Contractions: Irritability. Vag. Bleeding: None.  Movement: Present. Denies leaking of fluid.   The following portions of the patient's history were reviewed and updated as appropriate: allergies, current medications, past family history, past medical history, past social history, past surgical history and problem list. Problem list updated.  Objective:   Vitals:   08/13/18 0804  BP: 124/70  Pulse: (!) 108  Weight: 68.9 kg    Fetal Status: Fetal Heart Rate (bpm): 144 Fundal Height: 35 cm Movement: Present  Presentation: Vertex  General:  Alert, oriented and cooperative. Patient is in no acute distress.  Skin: Skin is warm and dry. No rash noted.   Cardiovascular: Normal heart rate noted  Respiratory: Normal respiratory effort, no problems with respiration noted  Abdomen: Soft, gravid, appropriate for gestational age.  Pain/Pressure: Absent     Pelvic: Cervical exam performed Dilation: Closed Effacement (%): Thick Station: Ballotable  Extremities: Normal range of motion.  Edema: None  Mental Status: Normal mood and affect. Normal behavior. Normal judgment and thought content.   Assessment and Plan:    Pregnancy: [id number] at [redacted]w[redacted]d  1. Supervision of other normal pregnancy, antepartum -Routine prenatal care -Anticipatory guidance given. Babyscripts patient. -F/U in 4 weeks.  2. Significant discrepancy between uterine size and clinical dates, antepartum, third trimester -Mild Polyhydramnios on last u/s, scheduled for f/u AFI today at 10am with MFM  3. Previous cesarean delivery affecting pregnancy, antepartum -Desires to [facility] -Consents signed  4. Pelvic Pain affecting pregnancy in third trimester, antepartum -Cervical exam in office today. Not dilated. -UA performed in office today. WNL. -Most likely related to high parity -Educated on preterm labor precautions  Preterm labor symptoms and general obstetric precautions including but not limited to vaginal bleeding, contractions, leaking of fluid and fetal movement were reviewed in detail with the patient. Please refer to After Visit Summary for other counseling recommendations.  Return in about 4 weeks (around 09/10/2018).  Future Appointments  Date Time Provider Department Center  09/10/2018  8:30 AM Donette Larry, CNM [facility]    Louis Matte. Makenzye Troutman, SNP  I confirm that I have verified the information documented in the student's note and that I have also personally re-performed the physical exam and all medical decision making activities.   Donette Larry, CNM  08/13/2018 11:52 AM

## 2018-08-28 ENCOUNTER — Inpatient Hospital Stay (HOSPITAL_COMMUNITY)
Admission: AD | Admit: 2018-08-28 | Discharge: 2018-08-28 | Disposition: A | Discharge status: Home / Self Care | Payer: Medicaid Other | Attending: Obstetrics and Gynecology | Admitting: Obstetrics and Gynecology

## 2018-08-28 ENCOUNTER — Encounter (HOSPITAL_COMMUNITY): Payer: Self-pay

## 2018-08-28 ENCOUNTER — Other Ambulatory Visit: Payer: Self-pay

## 2018-08-28 DIAGNOSIS — O26893 Other specified pregnancy related conditions, third trimester: Secondary | ICD-10-CM | POA: Insufficient documentation

## 2018-08-28 DIAGNOSIS — R05 Cough: Secondary | ICD-10-CM | POA: Insufficient documentation

## 2018-08-28 DIAGNOSIS — O3483 Maternal care for other abnormalities of pelvic organs, third trimester: Secondary | ICD-10-CM | POA: Diagnosis not present

## 2018-08-28 DIAGNOSIS — N8110 Cystocele, unspecified: Secondary | ICD-10-CM

## 2018-08-28 DIAGNOSIS — Z3A34 34 weeks gestation of pregnancy: Secondary | ICD-10-CM | POA: Diagnosis not present

## 2018-08-28 DIAGNOSIS — N815 Vaginal enterocele: Secondary | ICD-10-CM | POA: Insufficient documentation

## 2018-08-28 DIAGNOSIS — R059 Cough, unspecified: Secondary | ICD-10-CM

## 2018-08-28 LAB — URINALYSIS, ROUTINE W REFLEX MICROSCOPIC
BILIRUBIN URINE: NEGATIVE
Glucose, UA: NEGATIVE mg/dL
HGB URINE DIPSTICK: NEGATIVE
Ketones, ur: NEGATIVE mg/dL
Leukocytes,Ua: NEGATIVE
NITRITE: NEGATIVE
PH: 8 (ref 5.0–8.0)
Protein, ur: NEGATIVE mg/dL
SPECIFIC GRAVITY, URINE: 1.003 — AB (ref 1.005–1.030)

## 2018-08-28 NOTE — MAU Note (Signed)
Jillian Mcguire is a 38 y.o. at [redacted]w[redacted]d here in MAU reporting: cough since last Thursday, reports no fever. Reports no SOB. States that she can feel a bulge on the outside of her vagina. Unsure if it from the coughing. It is not painful, no discharge, no LOF, + FM  Onset of complaint: ongoing  Pain score: 0/10  Vitals:   08/28/18 1206  BP: 109/64  Pulse: 80  Resp: 18  SpO2: 100%      Lab orders placed from triage: UA, droplet precautions

## 2018-08-28 NOTE — MAU Provider Note (Signed)
Chief Complaint:  Cough and Vaginal bulge   First Provider Initiated Contact with Patient 08/28/18 1246      HPI: Jillian Mcguire is a 38 y.o. [id number] at [redacted]w[redacted]d by early ultrasound who presents to maternity admissions reporting bulge in the vaginal area and recent coughing.  She reports she had coughing, nasal congestion starting 4 days ago that is improving.  There was no fever and no shortness of breath.  She took Mucinex x 2 days but is no longer requiring medicine for her cough since it is only occasional.  This morning she was in the shower and felt something bulge in her vagina.  She reached down and felt something sticking out, coming from inside on the vaginal wall. There was no pain. She pushed on the area while standing and it pushed back in but came back out right away.  She was concerned so came in to be evaluated. There are no other symptoms. She has not tried any other treatments.  She reports good fetal movement, denies LOF, vaginal bleeding, vaginal itching/burning, urinary symptoms, h/a, dizziness, n/v, or fever/chills.    HPI  Past Medical History: Past Medical History:  Diagnosis Date  . Anxiety   . Lactose intolerance   . PCOS (polycystic ovarian syndrome)   . PONV (postoperative nausea and vomiting)    Pt was put under general for CSection due to mal function with epidural    Past obstetric history: OB History  Gravida Para Term Preterm AB Living  SAB TAB Ectopic Multiple Live Births  2 0   1 4    # Outcome Date GA Lbr Len/2nd Weight Sex Delivery Anes PTL Lv  6 Current           5 Term 07/18/15 [redacted]w[redacted]d 12:12 / 00:16 4605 g M VBAC EPI  LIV  4A Preterm 10/07/11 [redacted]w[redacted]d 25:28 / 02:34 3019 g F Vag-Spont EPI  LIV     Birth Comments: WNL  4B Preterm 10/07/11 [redacted]w[redacted]d 25:28 / 03:02 3051 g M CS-LTranv EPI  LIV     Birth Comments: bruiisng aroung the mouth, dolicocephaly  3 SAB 11/11/10          2 SAB 09/09/10          1 Term 03/09/09 [redacted]w[redacted]d  3884 g M CS-Unspec  Gen N LIV     Birth Comments: induction for convenience; fetal distress    Past Surgical History: Past Surgical History:  Procedure Laterality Date  . BREAST BIOPSY  2009  . BREAST ENHANCEMENT SURGERY  2007  . breast fibroid    . CESAREAN SECTION  2010  . CESAREAN SECTION  10/07/2011   Procedure: CESAREAN SECTION;  Surgeon: Adam Phenix, MD;  Location: [facility];  Service: Gynecology;  Laterality: N/A;  Repair of second degree vaginal tear.  . CHOLECYSTECTOMY  2009  . TONSILLECTOMY  2005    Family History: Family History  Problem Relation Age of Onset  . Heart disease Mother   . Fibroids Mother   . Endometriosis Mother   . Cancer Maternal Grandmother        ovarian  . Hypertension Maternal Grandfather   . Diabetes Maternal Grandfather   . Cancer Paternal Grandfather        brain tumor  . Cancer Paternal Grandmother        uterine  . Anesthesia problems Neg Hx     Social History: Social History   Tobacco Use  .  Smoking status: Never Smoker  . Smokeless tobacco: Never Used  Substance Use Topics  . Alcohol use: No  . Drug use: No    Allergies:  Allergies  Allergen Reactions  . Erythromycin Swelling  . Flagyl [Metronidazole Hcl] Swelling  . Erythromycin Base Other (See Comments)    unknown  . Metronidazole Other (See Comments)    unknown  . Morphine And Related Nausea And Vomiting    Meds:  No medications prior to admission.    ROS:  Review of Systems  Constitutional: Negative for chills, fatigue and fever.  Eyes: Negative for visual disturbance.  Respiratory: Negative for shortness of breath.   Cardiovascular: Negative for chest pain.  Gastrointestinal: Negative for abdominal pain, nausea and vomiting.  Genitourinary: Negative for difficulty urinating, dysuria, flank pain, pelvic pain, vaginal bleeding, vaginal discharge and vaginal pain.  Skin:       Bulge from vaginal wall   Neurological: Negative for dizziness and headaches.   Psychiatric/Behavioral: Negative.      I have reviewed patient's Past Medical Hx, Surgical Hx, Family Hx, Social Hx, medications and allergies.   Physical Exam   Patient Vitals for the past 24 hrs:  BP Temp Temp src Pulse Resp SpO2 Height Weight  08/28/18 1325 (!) 94/59 - - 71 - - - -  08/28/18 1206 109/64 98 F (36.7 C) Oral 80 18 100 % - -  08/28/18 1200 - - - - - - 5\' 5"  (1.651 m) 69.6 kg   Constitutional: Well-developed, well-nourished female in no acute distress.  HEART: normal rate, heart sounds, regular rhythm RESP: normal effort, lung sounds clear and equal bilaterally GI: Abd soft, non-tender, gravid appropriate for gestational age.  MS: Extremities nontender, no edema, normal ROM Neurologic: Alert and oriented x 4.  GU: Neg CVAT.   Dilation: Closed Effacement (%): Thick Exam by:: leftwich kirby cnm  FHT:  Baseline 140 , moderate variability, accelerations present, no decelerations Contractions: None on toco or to palpation   Labs: Results for orders placed or performed during the hospital encounter of 08/28/18 (from the past 24 hour(s))  Urinalysis, Routine w reflex microscopic     Status: Abnormal   Collection Time: 08/28/18 11:58 AM  Result Value Ref Range   Color, Urine YELLOW YELLOW   APPearance HAZY (A) CLEAR   Specific Gravity, Urine 1.003 (L) 1.005 - 1.030   pH 8.0 5.0 - 8.0   Glucose, UA NEGATIVE NEGATIVE mg/dL   Hgb urine dipstick NEGATIVE NEGATIVE   Bilirubin Urine NEGATIVE NEGATIVE   Ketones, ur NEGATIVE NEGATIVE mg/dL   Protein, ur NEGATIVE NEGATIVE mg/dL   Nitrite NEGATIVE NEGATIVE   Leukocytes,Ua NEGATIVE NEGATIVE   A/RH(D) POSITIVE/-- (10/04 1001)  Imaging:    MAU Course/MDM: Orders Placed This Encounter  Procedures  . Urinalysis, Routine w reflex microscopic  . Droplet Isolation  . Discharge patient    No orders of the defined types were placed in this encounter.    NST reviewed and reactive Pt respiratory symptoms are mild  and pt has no comorbidities.  Symptoms are also improving so COVID-19 risk is low per ACOG assessment for pregnant women algorithm.  Pt to continue symptomatic care at home.  Pt was on contact/droplet precautions during MAU visit.  On evaluation, vaginal bulge not present with pt supine, even with valsalva.  Exam reveals no abnormalities of vaginal walls.  Cervix closed/thick/high so no evidence of preterm labor.  Bulge with pt standing is likely pelvic floor prolapse, either cystocele or  rectocele that resolves easily.  Reassurance provided.  Pt instructed to lie down if prolapse occurs again and push bulging tissue back into vagina gently.  Seek care for pain, bleeding, signs of labor, or decreased fetal movement.  Keep scheduled appts at [facility] office.  Pt discharge with strict return precautions.    Assessment: 1. Prolapse of anterior vaginal wall   2. Cough     Plan: Discharge home Labor precautions and fetal kick counts Follow-up Information    Center for [facility] [place] Follow up.   Specialty:  Obstetrics and Gynecology Contact information: [address], [address] [place] [address] [phone]       [facility] Follow up.   Why:  Return to MAU as needed for OB emergencies or signs of labor Contact information: [address] [place] [address] [phone]         Allergies as of 08/28/2018      Reactions   Erythromycin Swelling   Flagyl [metronidazole Hcl] Swelling   Erythromycin Base Other (See Comments)   unknown   Metronidazole Other (See Comments)   unknown   Morphine And Related Nausea And Vomiting      Medication List    TAKE these medications   Accu-Chek FastClix Lancets Misc Check CBG's BID   glucose blood test strip Commonly known as:  Accu-Chek Guide Use 1 strip to test CBG's BID   IRON PO Take 1 tablet by mouth daily. Reported on 08/29/2015   OVER THE COUNTER  MEDICATION Take 3 tablets by mouth 2 (two) times daily. Fruit, vegetable and berry blends. Vitamins meet pregnancy folic acid requirements.   PRESCRIPTION MEDICATION       Sharen Counter Certified Nurse-Midwife 08/28/2018 2:39 PM

## 2018-08-28 NOTE — MAU Note (Signed)
Urine in lab 

## 2018-09-10 ENCOUNTER — Other Ambulatory Visit (HOSPITAL_COMMUNITY)
Admission: RE | Admit: 2018-09-10 | Discharge: 2018-09-10 | Disposition: A | Discharge status: Home / Self Care | Payer: Medicaid Other | Source: Ambulatory Visit | Attending: Certified Nurse Midwife | Admitting: Certified Nurse Midwife

## 2018-09-10 ENCOUNTER — Ambulatory Visit (INDEPENDENT_AMBULATORY_CARE_PROVIDER_SITE_OTHER): Payer: Medicaid Other | Admitting: Certified Nurse Midwife

## 2018-09-10 ENCOUNTER — Other Ambulatory Visit: Payer: Self-pay

## 2018-09-10 VITALS — BP 103/67 | HR 110 | Wt 157.0 lb

## 2018-09-10 DIAGNOSIS — Z3483 Encounter for supervision of other normal pregnancy, third trimester: Secondary | ICD-10-CM

## 2018-09-10 DIAGNOSIS — Z3480 Encounter for supervision of other normal pregnancy, unspecified trimester: Secondary | ICD-10-CM | POA: Insufficient documentation

## 2018-09-10 DIAGNOSIS — Z3A36 36 weeks gestation of pregnancy: Secondary | ICD-10-CM

## 2018-09-10 DIAGNOSIS — O34219 Maternal care for unspecified type scar from previous cesarean delivery: Secondary | ICD-10-CM

## 2018-09-10 LAB — OB RESULTS CONSOLE GBS: GBS: NEGATIVE

## 2018-09-10 NOTE — Progress Notes (Signed)
Subjective:  VERNELLE FLUET is a 38 y.o. [id number] at [redacted]w[redacted]d being seen today for ongoing prenatal care.  She is currently monitored for the following issues for this high-risk pregnancy and has Previous cesarean delivery affecting pregnancy, antepartum; PCO (polycystic ovaries); History of preterm delivery, currently pregnant in first trimester; History of shoulder dystocia in prior pregnancy; Supervision of other normal pregnancy, antepartum; Polyhydramnios affecting pregnancy; and Significant discrepancy between uterine size and clinical dates, antepartum on their problem list.  Patient reports no complaints.  Contractions: Irritability. Vag. Bleeding: None.  Movement: Present. Denies leaking of fluid.   The following portions of the patient's history were reviewed and updated as appropriate: allergies, current medications, past family history, past medical history, past social history, past surgical history and problem list. Problem list updated.  Objective:   Vitals:   09/10/18 0835  BP: 103/67  Pulse: (!) 110  Weight: 71.2 kg    Fetal Status: Fetal Heart Rate (bpm): 139 Fundal Height: 36 cm Movement: Present  Presentation: Vertex  General:  Alert, oriented and cooperative. Patient is in no acute distress.  Skin: Skin is warm and dry. No rash noted.   Cardiovascular: Normal heart rate noted  Respiratory: Normal respiratory effort, no problems with respiration noted  Abdomen: Soft, gravid, appropriate for gestational age. Pain/Pressure: Present     Pelvic: Vag. Bleeding: None Vag D/C Character: Thin   Cervical exam performed Dilation: Fingertip Effacement (%): Thick    Extremities: Normal range of motion.  Edema: None  Mental Status: Normal mood and affect. Normal behavior. Normal judgment and thought content.   Urinalysis:      Assessment and Plan:  Pregnancy: [id number] at [redacted]w[redacted]d  1. Supervision of other normal pregnancy, antepartum - Culture, beta strep (group b only) -  Cervicovaginal ancillary only( [place]) - on baby Rx- BPs not flowing into Epic, reviewed in [place] and all normal - ok for weekly televists until 40 wks  2. Previous cesarean delivery affecting pregnancy, antepartum - previous CS x2 and VBAC x1 - pt prefers TOLAC but if does not labor by 41 wks wants repeat CS   Preterm labor symptoms and general obstetric precautions including but not limited to vaginal bleeding, contractions, leaking of fluid and fetal movement were reviewed in detail with the patient. Please refer to After Visit Summary for other counseling recommendations.  Return in about 1 week (around 09/17/2018).   Donette Larry, CNM

## 2018-09-13 LAB — CULTURE, BETA STREP (GROUP B ONLY)
MICRO NUMBER:: 374053
SPECIMEN QUALITY:: ADEQUATE

## 2018-09-13 LAB — CERVICOVAGINAL ANCILLARY ONLY
Chlamydia: NEGATIVE
Neisseria Gonorrhea: NEGATIVE

## 2018-09-16 ENCOUNTER — Encounter: Payer: Self-pay | Admitting: *Deleted

## 2018-09-16 ENCOUNTER — Other Ambulatory Visit: Payer: Self-pay

## 2018-09-16 ENCOUNTER — Ambulatory Visit (INDEPENDENT_AMBULATORY_CARE_PROVIDER_SITE_OTHER): Payer: Medicaid Other | Admitting: Obstetrics & Gynecology

## 2018-09-16 VITALS — BP 100/63 | HR 100 | Wt 154.0 lb

## 2018-09-16 DIAGNOSIS — Z3480 Encounter for supervision of other normal pregnancy, unspecified trimester: Secondary | ICD-10-CM

## 2018-09-16 DIAGNOSIS — O09211 Supervision of pregnancy with history of pre-term labor, first trimester: Secondary | ICD-10-CM

## 2018-09-16 DIAGNOSIS — O09891 Supervision of other high risk pregnancies, first trimester: Secondary | ICD-10-CM

## 2018-09-16 DIAGNOSIS — Z3A37 37 weeks gestation of pregnancy: Secondary | ICD-10-CM

## 2018-09-16 DIAGNOSIS — O09213 Supervision of pregnancy with history of pre-term labor, third trimester: Secondary | ICD-10-CM

## 2018-09-16 DIAGNOSIS — O34219 Maternal care for unspecified type scar from previous cesarean delivery: Secondary | ICD-10-CM

## 2018-09-16 NOTE — Progress Notes (Signed)
   PRENATAL VISIT NOTE  Subjective:  Jillian Mcguire is a 38 y.o. [phone] at [redacted]w[redacted]d being seen today for ongoing prenatal care.  She is currently monitored for the following issues for this low-risk pregnancy and has Previous cesarean delivery affecting pregnancy, antepartum; PCO (polycystic ovaries); History of preterm delivery, currently pregnant in first trimester; History of shoulder dystocia in prior pregnancy; Supervision of other normal pregnancy, antepartum; Polyhydramnios affecting pregnancy; and Significant discrepancy between uterine size and clinical dates, antepartum on their problem list.  Patient reports no complaints.  Contractions: Irritability. Vag. Bleeding: None.  Movement: Present. Denies leaking of fluid.   The following portions of the patient's history were reviewed and updated as appropriate: allergies, current medications, past family history, past medical history, past social history, past surgical history and problem list.   Objective:   Vitals:   09/16/18 1205  BP: 100/63  Pulse: 100  Weight: 154 lb (69.9 kg)    Fetal Status:     Movement: Present     General:  Alert, oriented and cooperative. Patient is in no acute distress.  Skin: Skin is warm and dry. No rash noted.   Cardiovascular: Normal heart rate noted  Respiratory: Normal respiratory effort, no problems with respiration noted  Abdomen: Soft, gravid, appropriate for gestational age.  Pain/Pressure: Present     Pelvic: Cervical exam deferred        Extremities: Normal range of motion.  Edema: Trace  Mental Status: Normal mood and affect. Normal behavior. Normal judgment and thought content.   Assessment and Plan:  Pregnancy: [id number] at [redacted]w[redacted]d 1. Previous cesarean delivery affecting pregnancy, antepartum - She wants a 3rd VBAC, but IOL is relatively contraindicated due to h/o c/s x 2 so I would like her to come in for visits weekly starting at 39 weeks for membrane sweeping.  3. Supervision of  other normal pregnancy, antepartum   Term labor symptoms and general obstetric precautions including but not limited to vaginal bleeding, contractions, leaking of fluid and fetal movement were reviewed in detail with the patient. Please refer to After Visit Summary for other counseling recommendations.   No follow-ups on file.  Future Appointments  Date Time Provider Department Center  09/21/2018  2:15 PM Mcneil Sober [facility]  09/30/2018  3:45 PM Allie Bossier, MD [facility]  10/04/2018  8:30 AM Allie Bossier, MD [facility]    Allie Bossier, MD

## 2018-09-21 ENCOUNTER — Ambulatory Visit (INDEPENDENT_AMBULATORY_CARE_PROVIDER_SITE_OTHER): Payer: Medicaid Other | Admitting: Certified Nurse Midwife

## 2018-09-21 ENCOUNTER — Other Ambulatory Visit: Payer: Self-pay

## 2018-09-21 VITALS — BP 97/60 | HR 89

## 2018-09-21 DIAGNOSIS — Z3A37 37 weeks gestation of pregnancy: Secondary | ICD-10-CM

## 2018-09-21 DIAGNOSIS — Z3483 Encounter for supervision of other normal pregnancy, third trimester: Secondary | ICD-10-CM

## 2018-09-21 DIAGNOSIS — O34219 Maternal care for unspecified type scar from previous cesarean delivery: Secondary | ICD-10-CM

## 2018-09-21 DIAGNOSIS — Z3480 Encounter for supervision of other normal pregnancy, unspecified trimester: Secondary | ICD-10-CM

## 2018-09-21 DIAGNOSIS — Z8759 Personal history of other complications of pregnancy, childbirth and the puerperium: Secondary | ICD-10-CM

## 2018-09-21 NOTE — Progress Notes (Signed)
   TELEHEALTH VIRTUAL OBSTETRICS VISIT ENCOUNTER NOTE  I connected with Jillian Mcguire on 09/21/18 at  1:27 PM EDT by telephone at home and verified that I am speaking with the correct person using two identifiers.   I discussed the limitations, risks, security and privacy concerns of performing an evaluation and management service by telephone and the availability of in person appointments. I also discussed with the patient that there may be a patient responsible charge related to this service. The patient expressed understanding and agreed to proceed.  Subjective:  Jillian Mcguire is a 38 y.o. [id number] at [redacted]w[redacted]d being followed for ongoing prenatal care.  She is currently monitored for the following issues for this high-risk pregnancy and has Previous cesarean delivery affecting pregnancy, antepartum; PCO (polycystic ovaries); History of preterm delivery, currently pregnant in first trimester; History of shoulder dystocia in prior pregnancy; Supervision of other normal pregnancy, antepartum; Polyhydramnios affecting pregnancy; and Significant discrepancy between uterine size and clinical dates, antepartum on their problem list.  Patient reports no complaints. Reports fetal movement. Denies any contractions, bleeding or leaking of fluid.   The following portions of the patient's history were reviewed and updated as appropriate: allergies, current medications, past family history, past medical history, past social history, past surgical history and problem list.   Objective:   General:  Alert, oriented and cooperative.   Mental Status: Normal mood and affect perceived. Normal judgment and thought content.  Rest of physical exam deferred due to type of encounter  Assessment and Plan:  Pregnancy: [id number] at [redacted]w[redacted]d 1. Supervision of other normal pregnancy, antepartum - Patient with no pregnancy related complaints, patient reports Grandmother being sick and possibility of not surviving past  Thursday - Good support from family, given patient comfort on if she needs to vent she can always talk to [facility] as we are her support as well and more than just her OB  - Routine prenatal care  - Anticipatory guidance on upcoming appointments with membrane sweeping at next appointment  - COVID 19 precautions discussed   2. Previous cesarean delivery affecting pregnancy, antepartum - Plans VBAC, consent signed   3. History of shoulder dystocia in prior pregnancy - Educated and discussed use of EPO, RRT and IC to induce contractions in addition to membrane sweeping at next appointment   Term labor symptoms and general obstetric precautions including but not limited to vaginal bleeding, contractions, leaking of fluid and fetal movement were reviewed in detail with the patient.  I discussed the assessment and treatment plan with the patient. The patient was provided an opportunity to ask questions and all were answered. The patient agreed with the plan and demonstrated an understanding of the instructions. The patient was advised to call back or seek an in-person office evaluation/go to MAU at [facility] for any urgent or concerning symptoms. Please refer to After Visit Summary for other counseling recommendations.   I provided 15 minutes of non-face-to-face time during this encounter.  Return in about 8 days (around 09/29/2018) for ROB (IN PERSON-MEMBRANE SWEEP).  Future Appointments  Date Time Provider Department Center  09/30/2018  3:45 PM Allie Bossier, MD [facility]  10/04/2018  8:30 AM Allie Bossier, MD [facility]    Sharyon Cable, CNM Center for [employer], [facility]

## 2018-09-30 ENCOUNTER — Other Ambulatory Visit: Payer: Self-pay

## 2018-09-30 ENCOUNTER — Ambulatory Visit (INDEPENDENT_AMBULATORY_CARE_PROVIDER_SITE_OTHER): Payer: Medicaid Other | Admitting: Obstetrics & Gynecology

## 2018-09-30 VITALS — BP 102/62 | HR 76 | Wt 158.0 lb

## 2018-09-30 DIAGNOSIS — Z8759 Personal history of other complications of pregnancy, childbirth and the puerperium: Secondary | ICD-10-CM

## 2018-09-30 DIAGNOSIS — O409XX0 Polyhydramnios, unspecified trimester, not applicable or unspecified: Secondary | ICD-10-CM

## 2018-09-30 DIAGNOSIS — Z3480 Encounter for supervision of other normal pregnancy, unspecified trimester: Secondary | ICD-10-CM

## 2018-09-30 DIAGNOSIS — Z3A39 39 weeks gestation of pregnancy: Secondary | ICD-10-CM

## 2018-09-30 DIAGNOSIS — O34219 Maternal care for unspecified type scar from previous cesarean delivery: Secondary | ICD-10-CM

## 2018-09-30 NOTE — Progress Notes (Signed)
   PRENATAL VISIT NOTE  Subjective:  Jillian Mcguire is a 38 y.o. [id number] at [redacted]w[redacted]d being seen today for ongoing prenatal care.  She is currently monitored for the following issues for this low-risk pregnancy and has Previous cesarean delivery affecting pregnancy, antepartum; PCO (polycystic ovaries); History of preterm delivery, currently pregnant in first trimester; History of shoulder dystocia in prior pregnancy; Supervision of other normal pregnancy, antepartum; Polyhydramnios affecting pregnancy; and Significant discrepancy between uterine size and clinical dates, antepartum on their problem list.  Patient reports no complaints.  Contractions: Irritability. Vag. Bleeding: None.  Movement: Present. Denies leaking of fluid.   The following portions of the patient's history were reviewed and updated as appropriate: allergies, current medications, past family history, past medical history, past social history, past surgical history and problem list.   Objective:   Vitals:   09/30/18 1543  BP: 102/62  Pulse: 76  Weight: 158 lb (71.7 kg)    Fetal Status: Fetal Heart Rate (bpm): 136   Movement: Present     General:  Alert, oriented and cooperative. Patient is in no acute distress.  Skin: Skin is warm and dry. No rash noted.   Cardiovascular: Normal heart rate noted  Respiratory: Normal respiratory effort, no problems with respiration noted  Abdomen: Soft, gravid, appropriate for gestational age.  Pain/Pressure: Present     Pelvic: Cervical exam performed        Extremities: Normal range of motion.  Edema: Trace  Mental Status: Normal mood and affect. Normal behavior. Normal judgment and thought content.   Assessment and Plan:  Pregnancy: D5H2992 at [redacted]w[redacted]d 1. Previous cesarean delivery affecting pregnancy, antepartum - planning VBAC  2. Supervision of other normal pregnancy, antepartum - start NST at 40 weeks, has appt scheduled  3. Polyhydramnios affecting pregnancy - resolved  4. History of shoulder dystocia in prior pregnancy This baby is much smaller, EFW about 7 pounds by [employer] today  Term labor symptoms and general obstetric precautions including but not limited to vaginal bleeding, contractions, leaking of fluid and fetal movement were reviewed in detail with the patient. Please refer to After Visit Summary for other counseling recommendations.   No follow-ups on file.  Future Appointments  Date Time Provider Department Center  10/04/2018  8:30 AM Allie Bossier, MD [facility]    Allie Bossier, MD

## 2018-09-30 NOTE — Progress Notes (Signed)
Membranes sweep

## 2018-10-04 ENCOUNTER — Other Ambulatory Visit: Payer: Self-pay

## 2018-10-04 ENCOUNTER — Ambulatory Visit (INDEPENDENT_AMBULATORY_CARE_PROVIDER_SITE_OTHER): Payer: Medicaid Other | Admitting: Obstetrics & Gynecology

## 2018-10-04 VITALS — BP 113/66 | HR 91 | Wt 157.0 lb

## 2018-10-04 DIAGNOSIS — O34219 Maternal care for unspecified type scar from previous cesarean delivery: Secondary | ICD-10-CM

## 2018-10-04 DIAGNOSIS — Z3480 Encounter for supervision of other normal pregnancy, unspecified trimester: Secondary | ICD-10-CM

## 2018-10-04 DIAGNOSIS — Z3A39 39 weeks gestation of pregnancy: Secondary | ICD-10-CM

## 2018-10-04 NOTE — Progress Notes (Signed)
She was supposed to come in for NST and cervical check at 40 weeks, but it was erroneously scheduled for today. I checked her cervix and reviewed labor precautions. She will come back for NST at 40 weeks.

## 2018-10-06 ENCOUNTER — Inpatient Hospital Stay (HOSPITAL_COMMUNITY)
Admission: AD | Admit: 2018-10-06 | Discharge: 2018-10-08 | DRG: 807 | Disposition: A | Discharge status: Home / Self Care | Payer: Medicaid Other | Attending: Obstetrics & Gynecology | Admitting: Obstetrics & Gynecology

## 2018-10-06 ENCOUNTER — Other Ambulatory Visit: Payer: Self-pay

## 2018-10-06 ENCOUNTER — Encounter (HOSPITAL_COMMUNITY): Payer: Self-pay

## 2018-10-06 DIAGNOSIS — O403XX0 Polyhydramnios, third trimester, not applicable or unspecified: Principal | ICD-10-CM | POA: Diagnosis present

## 2018-10-06 DIAGNOSIS — O409XX0 Polyhydramnios, unspecified trimester, not applicable or unspecified: Secondary | ICD-10-CM | POA: Diagnosis present

## 2018-10-06 DIAGNOSIS — Z8759 Personal history of other complications of pregnancy, childbirth and the puerperium: Secondary | ICD-10-CM

## 2018-10-06 DIAGNOSIS — O26849 Uterine size-date discrepancy, unspecified trimester: Secondary | ICD-10-CM

## 2018-10-06 DIAGNOSIS — Z3480 Encounter for supervision of other normal pregnancy, unspecified trimester: Secondary | ICD-10-CM

## 2018-10-06 DIAGNOSIS — Z3A40 40 weeks gestation of pregnancy: Secondary | ICD-10-CM

## 2018-10-06 DIAGNOSIS — O34211 Maternal care for low transverse scar from previous cesarean delivery: Secondary | ICD-10-CM | POA: Diagnosis present

## 2018-10-06 NOTE — MAU Note (Signed)
Pt c/o contractions that are 6-8 mins apart. Lives in [place]. She was 3 cm and 100 on Monday. Denies LOF or bleeding. +FM

## 2018-10-07 ENCOUNTER — Other Ambulatory Visit: Payer: Self-pay

## 2018-10-07 ENCOUNTER — Inpatient Hospital Stay (HOSPITAL_COMMUNITY): Payer: Medicaid Other | Admitting: Anesthesiology

## 2018-10-07 ENCOUNTER — Encounter (HOSPITAL_COMMUNITY): Payer: Self-pay

## 2018-10-07 ENCOUNTER — Other Ambulatory Visit: Payer: Medicaid Other

## 2018-10-07 DIAGNOSIS — O364XX0 Maternal care for intrauterine death, not applicable or unspecified: Secondary | ICD-10-CM | POA: Diagnosis not present

## 2018-10-07 DIAGNOSIS — O34211 Maternal care for low transverse scar from previous cesarean delivery: Secondary | ICD-10-CM | POA: Diagnosis present

## 2018-10-07 DIAGNOSIS — Z3A40 40 weeks gestation of pregnancy: Secondary | ICD-10-CM | POA: Diagnosis not present

## 2018-10-07 DIAGNOSIS — O403XX0 Polyhydramnios, third trimester, not applicable or unspecified: Secondary | ICD-10-CM | POA: Diagnosis present

## 2018-10-07 DIAGNOSIS — Z3A29 29 weeks gestation of pregnancy: Secondary | ICD-10-CM | POA: Diagnosis not present

## 2018-10-07 DIAGNOSIS — O26893 Other specified pregnancy related conditions, third trimester: Secondary | ICD-10-CM | POA: Diagnosis present

## 2018-10-07 LAB — URINALYSIS, ROUTINE W REFLEX MICROSCOPIC
Bilirubin Urine: NEGATIVE
Glucose, UA: NEGATIVE mg/dL
Hgb urine dipstick: NEGATIVE
Ketones, ur: 5 mg/dL — AB
Leukocytes,Ua: NEGATIVE
Nitrite: NEGATIVE
Protein, ur: NEGATIVE mg/dL
Specific Gravity, Urine: 1.01 (ref 1.005–1.030)
pH: 6 (ref 5.0–8.0)

## 2018-10-07 LAB — CBC
HCT: 36.8 % (ref 36.0–46.0)
Hemoglobin: 12.3 g/dL (ref 12.0–15.0)
MCH: 32.5 pg (ref 26.0–34.0)
MCHC: 33.4 g/dL (ref 30.0–36.0)
MCV: 97.4 fL (ref 80.0–100.0)
Platelets: 208 10*3/uL (ref 150–400)
RBC: 3.78 MIL/uL — ABNORMAL LOW (ref 3.87–5.11)
RDW: 15.4 % (ref 11.5–15.5)
WBC: 9.6 10*3/uL (ref 4.0–10.5)
nRBC: 0 % (ref 0.0–0.2)

## 2018-10-07 LAB — RPR: RPR Ser Ql: NONREACTIVE

## 2018-10-07 LAB — ABO/RH: ABO/RH(D): A POS

## 2018-10-07 LAB — PREPARE RBC (CROSSMATCH)

## 2018-10-07 MED ORDER — METHYLERGONOVINE MALEATE 0.2 MG/ML IJ SOLN: 0.2000 mg | INTRAMUSCULAR | Status: DC | PRN

## 2018-10-07 MED ORDER — MEASLES, MUMPS & RUBELLA VAC IJ SOLR: 0.5000 mL | Freq: Once | INTRAMUSCULAR | Status: DC

## 2018-10-07 MED ORDER — PHENYLEPHRINE 40 MCG/ML (10ML) SYRINGE FOR IV PUSH (FOR BLOOD PRESSURE SUPPORT)
80.0000 ug | PREFILLED_SYRINGE | INTRAVENOUS | Status: DC | PRN
  Administered 2018-10-07 (×2): 80 ug via INTRAVENOUS

## 2018-10-07 MED ORDER — METHYLERGONOVINE MALEATE 0.2 MG PO TABS: 0.2000 mg | ORAL_TABLET | ORAL | Status: DC | PRN

## 2018-10-07 MED ORDER — BENZOCAINE-MENTHOL 20-0.5 % EX AERO
1.0000 "application " | INHALATION_SPRAY | CUTANEOUS | Status: DC | PRN
  Administered 2018-10-07: 1 via TOPICAL
  Filled 2018-10-07: qty 56

## 2018-10-07 MED ORDER — PHENYLEPHRINE 40 MCG/ML (10ML) SYRINGE FOR IV PUSH (FOR BLOOD PRESSURE SUPPORT)
80.0000 ug | PREFILLED_SYRINGE | INTRAVENOUS | Status: DC | PRN
  Filled 2018-10-07: qty 10

## 2018-10-07 MED ORDER — OXYTOCIN 40 UNITS IN NORMAL SALINE INFUSION - SIMPLE MED
2.5000 [IU]/h | INTRAVENOUS | Status: DC
  Administered 2018-10-07: 15:00:00 2.5 [IU]/h via INTRAVENOUS
  Filled 2018-10-07: qty 1000

## 2018-10-07 MED ORDER — SOD CITRATE-CITRIC ACID 500-334 MG/5ML PO SOLN: 30.0000 mL | ORAL | Status: DC | PRN

## 2018-10-07 MED ORDER — DOCUSATE SODIUM 100 MG PO CAPS
100.0000 mg | ORAL_CAPSULE | Freq: Two times a day (BID) | ORAL | Status: DC
  Administered 2018-10-07 – 2018-10-08 (×2): 100 mg via ORAL
  Filled 2018-10-07 (×2): qty 1

## 2018-10-07 MED ORDER — LACTATED RINGERS IV SOLN
500.0000 mL | INTRAVENOUS | Status: DC | PRN
  Administered 2018-10-07: 500 mL via INTRAVENOUS

## 2018-10-07 MED ORDER — TETANUS-DIPHTH-ACELL PERTUSSIS 5-2.5-18.5 LF-MCG/0.5 IM SUSP: 0.5000 mL | Freq: Once | INTRAMUSCULAR | Status: DC

## 2018-10-07 MED ORDER — IBUPROFEN 600 MG PO TABS
600.0000 mg | ORAL_TABLET | Freq: Four times a day (QID) | ORAL | Status: DC
  Administered 2018-10-07 – 2018-10-08 (×4): 600 mg via ORAL
  Filled 2018-10-07 (×4): qty 1

## 2018-10-07 MED ORDER — ONDANSETRON HCL 4 MG/2ML IJ SOLN: 4.0000 mg | INTRAMUSCULAR | Status: DC | PRN

## 2018-10-07 MED ORDER — PRENATAL MULTIVITAMIN CH
1.0000 | ORAL_TABLET | Freq: Every day | ORAL | Status: DC
  Filled 2018-10-07: qty 1

## 2018-10-07 MED ORDER — LACTATED RINGERS IV SOLN
INTRAVENOUS | Status: DC
  Administered 2018-10-07 (×2): via INTRAVENOUS

## 2018-10-07 MED ORDER — DIBUCAINE (PERIANAL) 1 % EX OINT
1.0000 "application " | TOPICAL_OINTMENT | CUTANEOUS | Status: DC | PRN
  Administered 2018-10-07: 1 via RECTAL
  Filled 2018-10-07: qty 28

## 2018-10-07 MED ORDER — FENTANYL-BUPIVACAINE-NACL 0.5-0.125-0.9 MG/250ML-% EP SOLN: 12.0000 mL/h | EPIDURAL | Status: DC | PRN

## 2018-10-07 MED ORDER — FERROUS SULFATE 325 (65 FE) MG PO TABS
325.0000 mg | ORAL_TABLET | Freq: Two times a day (BID) | ORAL | Status: DC
  Administered 2018-10-07 – 2018-10-08 (×2): 325 mg via ORAL
  Filled 2018-10-07 (×2): qty 1

## 2018-10-07 MED ORDER — EPHEDRINE 5 MG/ML INJ: 10.0000 mg | INTRAVENOUS | Status: DC | PRN

## 2018-10-07 MED ORDER — LACTATED RINGERS IV SOLN
500.0000 mL | Freq: Once | INTRAVENOUS | Status: AC
  Administered 2018-10-07: 09:00:00 500 mL via INTRAVENOUS

## 2018-10-07 MED ORDER — LIDOCAINE HCL (PF) 1 % IJ SOLN: 30.0000 mL | INTRAMUSCULAR | Status: DC | PRN

## 2018-10-07 MED ORDER — FENTANYL-BUPIVACAINE-NACL 0.5-0.125-0.9 MG/250ML-% EP SOLN
12.0000 mL/h | EPIDURAL | Status: DC | PRN
  Filled 2018-10-07: qty 250

## 2018-10-07 MED ORDER — ZOLPIDEM TARTRATE 5 MG PO TABS: 5.0000 mg | ORAL_TABLET | Freq: Every evening | ORAL | Status: DC | PRN

## 2018-10-07 MED ORDER — SIMETHICONE 80 MG PO CHEW: 80.0000 mg | CHEWABLE_TABLET | ORAL | Status: DC | PRN

## 2018-10-07 MED ORDER — COCONUT OIL OIL: 1.0000 "application " | TOPICAL_OIL | Status: DC | PRN

## 2018-10-07 MED ORDER — ONDANSETRON HCL 4 MG/2ML IJ SOLN: 4.0000 mg | Freq: Four times a day (QID) | INTRAMUSCULAR | Status: DC | PRN

## 2018-10-07 MED ORDER — ACETAMINOPHEN 325 MG PO TABS: 650.0000 mg | ORAL_TABLET | ORAL | Status: DC | PRN

## 2018-10-07 MED ORDER — DIPHENHYDRAMINE HCL 25 MG PO CAPS: 25.0000 mg | ORAL_CAPSULE | Freq: Four times a day (QID) | ORAL | Status: DC | PRN

## 2018-10-07 MED ORDER — DIPHENHYDRAMINE HCL 50 MG/ML IJ SOLN: 12.5000 mg | INTRAMUSCULAR | Status: DC | PRN

## 2018-10-07 MED ORDER — SODIUM CHLORIDE 0.9% IV SOLUTION: Freq: Once | INTRAVENOUS | Status: DC

## 2018-10-07 MED ORDER — OXYTOCIN BOLUS FROM INFUSION
500.0000 mL | Freq: Once | INTRAVENOUS | Status: AC
  Administered 2018-10-07: 14:00:00 500 mL via INTRAVENOUS

## 2018-10-07 MED ORDER — BISACODYL 10 MG RE SUPP: 10.0000 mg | Freq: Every day | RECTAL | Status: DC | PRN

## 2018-10-07 MED ORDER — LIDOCAINE HCL (PF) 1 % IJ SOLN
INTRAMUSCULAR | Status: DC | PRN
  Administered 2018-10-07: 6 mL via EPIDURAL

## 2018-10-07 MED ORDER — FENTANYL CITRATE (PF) 100 MCG/2ML IJ SOLN: 100.0000 ug | INTRAMUSCULAR | Status: DC | PRN

## 2018-10-07 MED ORDER — ONDANSETRON HCL 4 MG PO TABS: 4.0000 mg | ORAL_TABLET | ORAL | Status: DC | PRN

## 2018-10-07 MED ORDER — WITCH HAZEL-GLYCERIN EX PADS
1.0000 "application " | MEDICATED_PAD | CUTANEOUS | Status: DC | PRN
  Administered 2018-10-07: 1 via TOPICAL

## 2018-10-07 MED ORDER — SODIUM CHLORIDE (PF) 0.9 % IJ SOLN
INTRAMUSCULAR | Status: DC | PRN
  Administered 2018-10-07: 14 mL/h via EPIDURAL

## 2018-10-07 MED ORDER — FLEET ENEMA 7-19 GM/118ML RE ENEM: 1.0000 | ENEMA | Freq: Every day | RECTAL | Status: DC | PRN

## 2018-10-07 MED ORDER — ACETAMINOPHEN 325 MG PO TABS
650.0000 mg | ORAL_TABLET | ORAL | Status: DC | PRN
  Administered 2018-10-07: 17:00:00 650 mg via ORAL
  Filled 2018-10-07: qty 2

## 2018-10-07 NOTE — MAU Note (Signed)
Ok to walk per Philipp Deputy

## 2018-10-07 NOTE — H&P (Signed)
LABOR AND DELIVERY ADMISSION HISTORY AND PHYSICAL NOTE  Jillian Mcguire is a 38 y.o. female [phone] with IUP at 6625w1d by 1st trimester US presenting for SOL.  She reports positive fetal movement. She denies leakage of fluid or vaginal bleeding.  [facility]  Pregnancy complications:  - History of c/s x 2 - History of shoulder dystocia - Polyhydramnios  Past Medical History: Past Medical History:  Diagnosis Date  . Anxiety   . Lactose intolerance   . PCOS (polycystic ovarian syndrome)   . PONV (postoperative nausea and vomiting)    Pt was put under general for CSection due to mal function with epidural    Past Surgical History: Past Surgical History:  Procedure Laterality Date  . BREAST BIOPSY  2009  . BREAST ENHANCEMENT SURGERY  2007  . breast fibroid    . CESAREAN SECTION  2010  . CESAREAN SECTION  10/07/2011   Procedure: CESAREAN SECTION;  Surgeon: Adam PhenixJames G Arnold, MD;  Location: [facility];  Service: Gynecology;  Laterality: N/A;  Repair of second degree vaginal tear.  . CHOLECYSTECTOMY  2009  . TONSILLECTOMY  2005    Obstetrical History: OB History    Gravida  6   Para  3   Term  2   Preterm  1   AB  2   Living  4     SAB  2   TAB  0   Ectopic      Multiple  1   Live Births  4           Social History: Social History   Socioeconomic History  . Marital status: Married    Spouse name: Not on file  . Number of children: Not on file  . Years of education: Not on file  . Highest education level: Not on file  Occupational History  . Occupation: homemaker  Social Needs  . Financial resource strain: Not on file  . Food insecurity:    Worry: Not on file    Inability: Not on file  . Transportation needs:    Medical: Not on file    Non-medical: Not on file  Tobacco Use  . Smoking status: Never Smoker  . Smokeless tobacco: Never Used  Substance and Sexual Activity  . Alcohol use: No  . Drug use: No   . Sexual activity: Yes    Partners: Male    Birth control/protection: None    Comment: pt is pregnant  Lifestyle  . Physical activity:    Days per week: Not on file    Minutes per session: Not on file  . Stress: Not on file  Relationships  . Social connections:    Talks on phone: Not on file    Gets together: Not on file    Attends religious service: Not on file    Active member of club or organization: Not on file    Attends meetings of clubs or organizations: Not on file    Relationship status: Not on file  Other Topics Concern  . Not on file  Social History Narrative  . Not on file    Family History: Family History  Problem Relation Age of Onset  . Heart disease Mother   . Fibroids Mother   . Endometriosis Mother   . Cancer Maternal Grandmother        ovarian  . Hypertension Maternal Grandfather   . Diabetes Maternal Grandfather   . Cancer Paternal Grandfather  brain tumor  . Cancer Paternal Grandmother        uterine  . Anesthesia problems Neg Hx     Allergies: Allergies  Allergen Reactions  . Erythromycin Swelling  . Flagyl [Metronidazole Hcl] Swelling  . Erythromycin Base Other (See Comments)    unknown  . Metronidazole Other (See Comments)    unknown  . Morphine And Related Nausea And Vomiting    Medications Prior to Admission  Medication Sig Dispense Refill Last Dose  . OVER THE COUNTER MEDICATION Take 3 tablets by mouth 2 (two) times daily. Fruit, vegetable and berry blends. Vitamins meet pregnancy folic acid requirements.   10/06/2018 at Unknown time  . ACCU-CHEK FASTCLIX LANCETS MISC Check CBG's BID (Patient not taking: Reported on 09/16/2018) 100 each 6 Not Taking  . albuterol (PROVENTIL) (2.5 MG/3ML) 0.083% nebulizer solution    Not Taking  . glucose blood (ACCU-CHEK GUIDE) test strip Use 1 strip to test CBG's BID (Patient not taking: Reported on 09/16/2018) 100 each 12 Not Taking  . hydrOXYzine (VISTARIL) 25 MG capsule    Not Taking  . IRON  PO Take 1 tablet by mouth daily. Reported on 08/29/2015   Taking  . PRESCRIPTION MEDICATION    Not Taking     Review of Systems  All systems reviewed and negative except as stated in HPI  Physical Exam Blood pressure 109/68, pulse 100, temperature 97.6 F (36.4 C), temperature source Oral, resp. rate 18, height 5\' 5"  (1.651 m), weight 71.9 kg, SpO2 100 %, currently breastfeeding. General appearance: alert, oriented, NAD Lungs: normal respiratory effort Heart: regular rate Abdomen: soft, non-tender; gravid, FH appropriate for GA Extremities: No calf swelling or tenderness Presentation: cephalic Fetal monitoring: baseline 120s/mod var/+ acels/no decels Uterine activity: q79m Dilation: 4 Effacement (%): 80 Station: -3 Exam by:: Bari Mantis RN   Prenatal labs: ABO, Rh: --/--/PENDING (04/30 0320) Antibody: PENDING (04/30 0320) Rubella: 3.67 (10/04 1001) RPR: NON-REACTIVE (02/07 0843)  HBsAg: NON-REACTIVE (10/04 1001)  HIV: NON-REACTIVE (02/07 0843)  GC/Chlamydia: negative GBS:   negative 2-hr GTT: none (A1C 5.1% and patient reports home monitoring normal x 1 week) Genetic screening:  Decline  Anatomy US: Normal  Prenatal Transfer Tool  Maternal Diabetes: No Genetic Screening: Declined Maternal Ultrasounds/Referrals: Normal Fetal Ultrasounds or other Referrals:  None Maternal Substance Abuse:  No Significant Maternal Medications:  None Significant Maternal Lab Results: None  Results for orders placed or performed during the hospital encounter of 10/06/18 (from the past 24 hour(s))  Urinalysis, Routine w reflex microscopic   Collection Time: 10/07/18 12:09 AM  Result Value Ref Range   Color, Urine YELLOW YELLOW   APPearance HAZY (A) CLEAR   Specific Gravity, Urine 1.010 1.005 - 1.030   pH 6.0 5.0 - 8.0   Glucose, UA NEGATIVE NEGATIVE mg/dL   Hgb urine dipstick NEGATIVE NEGATIVE   Bilirubin Urine NEGATIVE NEGATIVE   Ketones, ur 5 (A) NEGATIVE mg/dL   Protein, ur NEGATIVE  NEGATIVE mg/dL   Nitrite NEGATIVE NEGATIVE   Leukocytes,Ua NEGATIVE NEGATIVE  CBC   Collection Time: 10/07/18  3:20 AM  Result Value Ref Range   WBC 9.6 4.0 - 10.5 K/uL   RBC 3.78 (L) 3.87 - 5.11 MIL/uL   Hemoglobin 12.3 12.0 - 15.0 g/dL   HCT 33.8 32.9 - 19.1 %   MCV 97.4 80.0 - 100.0 fL   MCH 32.5 26.0 - 34.0 pg   MCHC 33.4 30.0 - 36.0 g/dL   RDW 66.0 60.0 - 45.9 %  Platelets 208 150 - 400 K/uL   nRBC 0.0 0.0 - 0.2 %  Type and screen [facility]   Collection Time: 10/07/18  3:20 AM  Result Value Ref Range   ABO/RH(D) PENDING    Antibody Screen PENDING    Sample Expiration      10/10/2018 Performed at [facility], [address] [place], [place] [address]     Patient Active Problem List   Diagnosis Date Noted  . Normal labor 10/07/2018  . Polyhydramnios affecting pregnancy 07/30/2018  . Significant discrepancy between uterine size and clinical dates, antepartum 07/30/2018  . Supervision of other normal pregnancy, antepartum 03/10/2018  . History of shoulder dystocia in prior pregnancy 08/29/2015  . History of preterm delivery, currently pregnant in first trimester 12/08/2014  . PCO (polycystic ovaries) 11/12/2011  . Previous cesarean delivery affecting pregnancy, antepartum 08/08/2011    Assessment: Jillian Mcguire is a 38 y.o. [id number] at [redacted]w[redacted]d here for SOL  #Labor: expectant management. Discussed risk and benefit of proceeding with a vaginal delivery versus repeat LTCS. She would like to proceed with vaginal delivery. Hopes to avoid augmentation with pitocin.  #Pain: hopes to avoid epidural #FWB: Cat 1 #ID: GBS (-) #MOF: breast #MOC: vasectomy  #Circ:  NA  Carnisha Feltz,MD 10/07/2018, 5:04 AM

## 2018-10-07 NOTE — Anesthesia Procedure Notes (Signed)
Epidural Patient location during procedure: OB Start time: 10/07/2018 9:38 AM End time: 10/07/2018 9:53 AM  Staffing Anesthesiologist: Bethena Midget, MD  Preanesthetic Checklist Completed: patient identified, site marked, surgical consent, pre-op evaluation, timeout performed, IV checked, risks and benefits discussed and monitors and equipment checked  Epidural Patient position: sitting Prep: site prepped and draped and DuraPrep Patient monitoring: continuous pulse ox and blood pressure Approach: midline Location: L3-L4 Injection technique: LOR air  Needle:  Needle type: Tuohy  Needle gauge: 17 G Needle length: 9 cm and 9 Needle insertion depth: 6 cm Catheter type: closed end flexible Catheter size: 19 Gauge Catheter at skin depth: 11 cm Test dose: negative  Assessment Events: blood not aspirated, injection not painful, no injection resistance, negative IV test and no paresthesia

## 2018-10-07 NOTE — Progress Notes (Signed)
Comfortable w/epidural.  FHR Cat 1.  Ctx q .  Cx C/C/0 station, no pressure yet.  Will labor down w/peanut ball

## 2018-10-07 NOTE — Progress Notes (Signed)
Pt wants epidural.  Discussed plan of care with pt.  She would like to avoid pitocin.  SROM at 0630. Ctx still irregular, cx 6/90-1/  Pt aware that an epidural could slow ctx, but is oK with that, as am I.  P

## 2018-10-07 NOTE — Anesthesia Preprocedure Evaluation (Signed)
Anesthesia Evaluation  Patient identified by MRN, date of birth, ID band Patient awake    Reviewed: Allergy & Precautions, H&P , NPO status , Patient's Chart, lab work & pertinent test results, reviewed documented beta blocker date and time   Airway Mallampati: II  TM Distance: >3 FB Neck ROM: full    Dental no notable dental hx.    Pulmonary neg pulmonary ROS,    Pulmonary exam normal breath sounds clear to auscultation       Cardiovascular negative cardio ROS Normal cardiovascular exam Rhythm:regular Rate:Normal     Neuro/Psych negative neurological ROS  negative psych ROS   GI/Hepatic negative GI ROS, Neg liver ROS,   Endo/Other  negative endocrine ROS  Renal/GU negative Renal ROS  negative genitourinary   Musculoskeletal   Abdominal   Peds  Hematology negative hematology ROS (+)   Anesthesia Other Findings   Reproductive/Obstetrics (+) Pregnancy                             Anesthesia Physical Anesthesia Plan  ASA: III  Anesthesia Plan: Epidural   Post-op Pain Management:    Induction:   PONV Risk Score and Plan:   Airway Management Planned:   Additional Equipment:   Intra-op Plan:   Post-operative Plan:   Informed Consent: I have reviewed the patients History and Physical, chart, labs and discussed the procedure including the risks, benefits and alternatives for the proposed anesthesia with the patient or authorized representative who has indicated his/her understanding and acceptance.     Dental Advisory Given  Plan Discussed with: CRNA and Anesthesiologist  Anesthesia Plan Comments: (Labs checked- platelets confirmed with RN in room. Fetal heart tracing, per RN, reported to be stable enough for sitting procedure. Discussed epidural, and patient consents to the procedure:  included risk of possible headache,backache, failed block, allergic reaction, and nerve  injury. This patient was asked if she had any questions or concerns before the procedure started.)        Anesthesia Quick Evaluation

## 2018-10-07 NOTE — Anesthesia Postprocedure Evaluation (Signed)
Anesthesia Post Note  Patient: Jillian Mcguire  Procedure(s) Performed: AN AD HOC LABOR EPIDURAL     Patient location during evaluation: Mother Baby Anesthesia Type: Epidural Level of consciousness: awake and alert Pain management: pain level controlled Vital Signs Assessment: post-procedure vital signs reviewed and stable Respiratory status: spontaneous breathing, nonlabored ventilation and respiratory function stable Cardiovascular status: stable Postop Assessment: no headache, no backache and epidural receding Anesthetic complications: no    Last Vitals:  Vitals:   10/07/18 1331 10/07/18 1432  BP:  (!) 102/58  Pulse:  79  Resp:  16  Temp:    SpO2: 98%     Last Pain:  Vitals:   10/07/18 1308  TempSrc: Oral  PainSc:    Pain Goal:                   [name] Needle

## 2018-10-07 NOTE — Discharge Summary (Signed)
Postpartum Discharge Summary     Patient Name: Jillian Mcguire DOB: [birth year] MRN: [id number]  Date of admission: 10/06/2018 Delivering Provider: Jacklyn Shell   Date of discharge: 10/07/2018  Admitting diagnosis: 40 WKS, CTX Intrauterine pregnancy: [redacted]w[redacted]d     Secondary diagnosis:  Active Problems:   History of shoulder dystocia in prior pregnancy   Polyhydramnios affecting pregnancy   Normal labor  Additional problems: prior cs X2     Discharge diagnosis: VBAC                                                                                                Post partum procedures:none  Augmentation: none  Complications: None  Hospital course:  Onset of Labor With Vaginal Delivery     38 y.o. yo [id number] at [redacted]w[redacted]d was admitted in Active Labor on 10/06/2018. Patient had an uncomplicated labor course as follows: Pt progressed w/o augmentation to 2nd stage, labored down about an hour, and pushed 3 times. Double nuchal, unreducable, delivered through  Membrane Rupture Time/Date: 6:45 AM ,10/07/2018   Intrapartum Procedures: Episiotomy: None [1]                                         Lacerations:  1st degree [2]  Patient had a delivery of a Viable infant. 10/07/2018  Information for the patient's newborn:  [place], Clotfelter [[id number]]  Delivery Method: Vaginal, Spontaneous(Filed from Delivery Summary)    Pateint had an uncomplicated postpartum course.  She is ambulating, tolerating a regular diet, passing flatus, and urinating well. Patient is discharged home in stable condition on 10/07/18.   Magnesium Sulfate recieved: No BMZ received: No  Physical exam  Vitals:   10/07/18 1326 10/07/18 1330 10/07/18 1331 10/07/18 1432  BP:    (!) 102/58  Pulse:    79  Resp:    16  Temp:      TempSrc:      SpO2: 97% 98% 98%   Weight:      Height:       General: alert Lochia: appropriate Uterine Fundus: firm Incision: N/A DVT Evaluation: No evidence of DVT seen  on physical exam. Labs: Lab Results  Component Value Date   WBC 9.6 10/07/2018   HGB 12.3 10/07/2018   HCT 36.8 10/07/2018   MCV 97.4 10/07/2018   PLT 208 10/07/2018   No flowsheet data found.  Discharge instruction: per After Visit Summary and "Baby and Me Booklet".  After visit meds:    Diet: home with mother  Activity: Advance as tolerated. Pelvic rest for 6 weeks.   Outpatient follow up:6 weeks Follow up Appt:No future appointments. Follow up Visit:   Please schedule this patient for Postpartum visit in: 4 weeks with the following provider: Any provider For C/S patients schedule nurse incision check in weeks 2 weeks:  Low risk pregnancy complicated by:  Delivery mode:  VBAC Anticipated Birth Control:  vasectomy, condoms prn PP Procedures needed:   Schedule Integrated BH visit: no  Newborn Data: Live born female  Birth Weight:   APGAR: 9, 9  Newborn Delivery   Birth date/time:  10/07/2018 14:08:00 Delivery type:  Vaginal, Spontaneous     Baby Feeding: Breast Disposition:home with mother   10/07/2018 Jacklyn ShellFrances Cresenzo-Dishmon, CNM

## 2018-10-08 DIAGNOSIS — O364XX0 Maternal care for intrauterine death, not applicable or unspecified: Secondary | ICD-10-CM

## 2018-10-08 DIAGNOSIS — Z3A29 29 weeks gestation of pregnancy: Secondary | ICD-10-CM

## 2018-10-08 MED ORDER — IBUPROFEN 600 MG PO TABS: 600.0000 mg | ORAL_TABLET | Freq: Four times a day (QID) | ORAL | 0 refills | Status: AC

## 2018-10-08 MED ORDER — ACETAMINOPHEN 325 MG PO TABS: 650.0000 mg | ORAL_TABLET | ORAL | 1 refills | Status: AC | PRN

## 2018-10-08 NOTE — Lactation Note (Signed)
This note was copied from a baby's chart. Lactation Consultation Note  Patient Name: Girl Glema Comfort [id number] Date: 10/08/2018   P5, 12 hour female infant. Infant had 2 stools and 2 voids since delivery. Mom is confident in her breastfeeding abilities. Per mom, she breastfeed twins and all her other children for 44 and 18 months. Mom is experienced at breastfeeding. LC did not see latch at this time, mom breastfeed prior to [facility] entering the room for 30 minutes.  Mom has a DEBP at home. Mom knows to breastfeed according hunger cues, 8 or more times within 24 hours. LC discussed I & O. Mom knows to call Nurse or LC if she has any questions or concerns regarding breastfeeding. Mom made aware of O/P services, breastfeeding support groups, community resources, and our phone # for post-discharge questions.  Maternal Data    Feeding Feeding Type: Breast Fed  LATCH Score                   Interventions    Lactation Tools Discussed/Used     Consult Status      Danelle Earthly 10/08/2018, 2:29 AM

## 2018-10-08 NOTE — Progress Notes (Signed)
MOB was referred for history of depression/anxiety. * Referral screened out by Clinical Social Worker because none of the following criteria appear to apply: ~ History of anxiety/depression during this pregnancy, or of post-partum depression following prior delivery. ~ Diagnosis of anxiety and/or depression within last 3 years OR * MOB's symptoms currently being treated with medication and/or therapy as needed. Per further chart review, MOB appears to have active order for Buspar 5mg daily. MOB also scored 1 ion [place] depression screening. This warrants no CSW concern. Please contact the Clinical Social Worker if needs arise, by MOB request, or if MOB scores greater than 9/yes to question 10 on Edinburgh Postpartum Depression Screen.    Jaquala Fuller S. Hedy Garro, MSW, LCSW-A [facility] [phone] 

## 2018-10-09 LAB — TYPE AND SCREEN
ABO/RH(D): A POS
Antibody Screen: NEGATIVE
Unit division: 0
Unit division: 0

## 2018-10-09 LAB — BPAM RBC
Blood Product Expiration Date: 202005082359
Blood Product Expiration Date: 202005082359
Unit Type and Rh: 6200
Unit Type and Rh: 6200

## 2018-11-04 ENCOUNTER — Ambulatory Visit: Payer: Medicaid Other | Admitting: Obstetrics & Gynecology

## 2018-11-17 ENCOUNTER — Telehealth: Payer: Self-pay | Admitting: *Deleted

## 2018-11-17 MED ORDER — DICLOXACILLIN SODIUM 250 MG PO CAPS: 250.0000 mg | ORAL_CAPSULE | Freq: Four times a day (QID) | ORAL | 0 refills | Status: DC

## 2018-11-17 NOTE — Telephone Encounter (Signed)
Pt called stating taht she is breast feeding and she is 6 weeks post delivery.  She states that she has had mastitis after each pregnancy and now the right breast if painful,has red streaks and she is feeling like the Flu.  She has a low grade fever.  She is afraid that it will get worse as the evening goes on and is requesting something for Mastitis.  She has been massing and putting warm compresses on the breast but without relief.  She does have her virtual PP visit tomorrow.  Will go ahead and send in a RX for Dicloxicillin to her pharmacy and will check on her tomorrow.

## 2018-11-18 ENCOUNTER — Other Ambulatory Visit: Payer: Self-pay

## 2018-11-18 ENCOUNTER — Ambulatory Visit (INDEPENDENT_AMBULATORY_CARE_PROVIDER_SITE_OTHER): Payer: Medicaid Other | Admitting: Obstetrics & Gynecology

## 2018-11-18 ENCOUNTER — Encounter: Payer: Self-pay | Admitting: Obstetrics & Gynecology

## 2018-11-18 NOTE — Progress Notes (Signed)
Post Partum Exam  Jillian Mcguire is a 38 y.o. [id number] female who presents for a postpartum visit. She is 6 weeks postpartum following a spontaneous vaginal delivery. I have fully reviewed the prenatal and intrapartum course. The delivery was at [redacted]w[redacted]d gestational weeks.  Anesthesia: epidural. Postpartum course has been eventful with mastitis in the Rt breast and hemorrhoids. Baby's course has been unremarkable. Baby is feeding by breast. Bleeding no bleeding. Bowel function is normal. Bladder function is normal. Patient is not sexually active. Contraception method is condoms. Postpartum depression screening:neg  The following portions of the patient's history were reviewed and updated as appropriate: allergies, current medications, past family history, past medical history, past social history, past surgical history and problem list. Last pap smear done 2018 and was Normal  Review of Systems Pertinent items are noted in HPI.    Objective:  Appears normal - video visit                                         Assessment:   Normal postpartum exam. Pap smear not done at today's visit.   Plan:   1. Contraception: condoms until vasectomy 2. Hemorrhoids- improving, using a probiotic

## 2019-10-20 ENCOUNTER — Ambulatory Visit: Payer: Medicaid Other | Admitting: Obstetrics and Gynecology

## 2019-10-20 ENCOUNTER — Other Ambulatory Visit: Payer: Self-pay

## 2019-10-20 ENCOUNTER — Encounter: Payer: Self-pay | Admitting: Obstetrics and Gynecology

## 2019-10-20 VITALS — BP 108/70 | HR 80 | Temp 98.7°F | Resp 16 | Ht 65.0 in | Wt 128.0 lb

## 2019-10-20 DIAGNOSIS — N898 Other specified noninflammatory disorders of vagina: Secondary | ICD-10-CM | POA: Diagnosis not present

## 2019-10-20 MED ORDER — VALACYCLOVIR HCL 1 G PO TABS: 1000.0000 mg | ORAL_TABLET | Freq: Two times a day (BID) | ORAL | 3 refills | Status: DC

## 2019-10-20 MED ORDER — AMOXICILLIN-POT CLAVULANATE 875-125 MG PO TABS: 1.0000 | ORAL_TABLET | Freq: Two times a day (BID) | ORAL | 0 refills | Status: DC

## 2019-10-20 NOTE — Progress Notes (Signed)
GYNECOLOGY OFFICE VISIT NOTE  History:  39 y.o. [id number] here today for sore area in right labia. She thought it was an ingrown hair and has gotten progressively worse over the weekend and through the week. Tried a sitz bath and looks more white now. No concerns about STI.  Past Medical History:  Diagnosis Date  . Anxiety   . Lactose intolerance   . PCOS (polycystic ovarian syndrome)   . PONV (postoperative nausea and vomiting)    Pt was put under general for CSection due to mal function with epidural    Past Surgical History:  Procedure Laterality Date  . BREAST BIOPSY  2009  . BREAST ENHANCEMENT SURGERY  2007  . breast fibroid    . CESAREAN SECTION  2010  . CESAREAN SECTION  10/07/2011   Procedure: CESAREAN SECTION;  Surgeon: Woodroe Mode, MD;  Location: [facility];  Service: Gynecology;  Laterality: N/A;  Repair of second degree vaginal tear.  . CHOLECYSTECTOMY  2009  . TONSILLECTOMY  2005     Current Outpatient Medications:  .  acetaminophen (TYLENOL) 325 MG tablet, Take 2 tablets (650 mg total) by mouth every 4 (four) hours as needed (for pain scale < 4). (Patient not taking: Reported on 10/20/2019), Disp: 60 tablet, Rfl: 1 .  amoxicillin-clavulanate (AUGMENTIN) 875-125 MG tablet, Take 1 tablet by mouth 2 (two) times daily., Disp: 20 tablet, Rfl: 0 .  dicloxacillin (DYNAPEN) 250 MG capsule, Take 1 capsule (250 mg total) by mouth 4 (four) times daily. (Patient not taking: Reported on 11/18/2018), Disp: 40 capsule, Rfl: 0 .  ibuprofen (ADVIL) 600 MG tablet, Take 1 tablet (600 mg total) by mouth every 6 (six) hours. (Patient not taking: Reported on 10/20/2019), Disp: 30 tablet, Rfl: 0 .  OVER THE COUNTER MEDICATION, Take 3 tablets by mouth 2 (two) times daily. Fruit, vegetable and berry blends. Vitamins meet pregnancy folic acid requirements., Disp: , Rfl:  .  valACYclovir (VALTREX) 1000 MG tablet, Take 1 tablet (1,000 mg total) by mouth 2 (two) times daily. Take for ten days., Disp:  20 tablet, Rfl: 3  The following portions of the patient's history were reviewed and updated as appropriate: allergies, current medications, past family history, past medical history, past social history, past surgical history and problem list.    Review of Systems:  Pertinent items noted in HPI and remainder of comprehensive ROS otherwise negative.   Objective:  Physical Exam BP 108/70   Pulse 80   Temp 98.7 F (37.1 C)   Resp 16   Ht 5\' 5"  (1.651 m)   Wt 128 lb (58.1 kg)   BMI 21.30 kg/m  CONSTITUTIONAL: Well-developed, well-nourished female in no acute distress.  HENT:  Normocephalic, atraumatic. External right and left ear normal. Oropharynx is clear and moist EYES: Conjunctivae and EOM are normal. Pupils are equal, round, and reactive to light. No scleral icterus.  NECK: Normal range of motion, supple, no masses SKIN: Skin is warm and dry. No rash noted. Not diaphoretic. No erythema. No pallor. NEUROLOGIC: Alert and oriented to person, place, and time. Normal reflexes, muscle tone coordination. No cranial nerve deficit noted. PSYCHIATRIC: Normal mood and affect. Normal behavior. Normal judgment and thought content. CARDIOVASCULAR: Normal heart rate noted RESPIRATORY: Effort normal, no problems with respiration noted ABDOMEN: Soft, no distention noted.   PELVIC: Normal appearing external genitalia; with 5 cm length lesion of erythematous skin with white plaques on top, no induration or cysts, not blistering in appearance, with one area  of scabbing, moderately tender to palpation, does not extend subcutaneously. HSV culture and wound culture obtained MUSCULOSKELETAL: Normal range of motion. No edema noted.  Exam done with chaperone present.  Labs and Imaging No results found.  Assessment & Plan:  1. Vaginal lesion Given erythema and plaque-like appearance, favor cellulitis vs HSV, however reviewed possibility of HSV with patient. Will start on antibiotics and valtrex (due to  patient discomfort) and await culture results. She is agreeable to plan. - Wound culture - Herpes simplex virus culture   Routine preventative health maintenance measures emphasized. Please refer to After Visit Summary for other counseling recommendations.   No follow-ups on file.   Total face-to-face time with patient: 12 minutes. Over 50% of encounter was spent on counseling and coordination of care.   Baldemar Lenis, M.D. Attending [facility])

## 2019-10-25 LAB — HERPES SIMPLEX VIRUS CULTURE
MICRO NUMBER:: 10474946
SPECIMEN QUALITY:: ADEQUATE

## 2019-10-25 LAB — WOUND CULTURE
MICRO NUMBER:: 10474947
SPECIMEN QUALITY:: ADEQUATE

## 2019-11-01 ENCOUNTER — Ambulatory Visit: Payer: Medicaid Other | Admitting: Certified Nurse Midwife

## 2019-11-01 ENCOUNTER — Other Ambulatory Visit (HOSPITAL_COMMUNITY)
Admission: RE | Admit: 2019-11-01 | Discharge: 2019-11-01 | Disposition: A | Discharge status: Home / Self Care | Payer: Medicaid Other | Source: Ambulatory Visit | Attending: Certified Nurse Midwife | Admitting: Certified Nurse Midwife

## 2019-11-01 ENCOUNTER — Other Ambulatory Visit: Payer: Self-pay

## 2019-11-01 ENCOUNTER — Encounter: Payer: Self-pay | Admitting: Certified Nurse Midwife

## 2019-11-01 VITALS — BP 98/64 | HR 70 | Temp 98.6°F | Resp 16 | Ht 65.0 in | Wt 128.0 lb

## 2019-11-01 DIAGNOSIS — N898 Other specified noninflammatory disorders of vagina: Secondary | ICD-10-CM | POA: Insufficient documentation

## 2019-11-01 DIAGNOSIS — Z124 Encounter for screening for malignant neoplasm of cervix: Secondary | ICD-10-CM | POA: Diagnosis not present

## 2019-11-01 MED ORDER — TERCONAZOLE 0.4 % VA CREA: 1.0000 | TOPICAL_CREAM | Freq: Every day | VAGINAL | 0 refills | Status: AC

## 2019-11-01 NOTE — Progress Notes (Signed)
History:  [name] is a 39 y.o. [id number] who presents to clinic today for vaginal irritation. Patient was seen recently for a vaginal lesion and given antibiotics for treatment. Since completion of the antibiotics, she started noticing redness and itching of her vagina and around rectum. She denies having any abnormal discharge or odor.   The following portions of the patient's history were reviewed and updated as appropriate: allergies, current medications, family history, past medical history, social history, past surgical history and problem list.  Review of Systems:  Review of Systems  Constitutional: Negative.   Respiratory: Negative.   Cardiovascular: Negative.   Gastrointestinal: Negative.   Genitourinary:       Vaginal irritation and itching   Neurological: Negative.   Psychiatric/Behavioral: Negative.      Objective:  Physical Exam BP 98/64   Pulse 70   Temp 98.6 F (37 C)   Resp 16   Ht 5\' 5"  (1.651 m)   Wt 128 lb (58.1 kg)   Breastfeeding No   BMI 21.30 kg/m  Physical Exam Exam conducted with a chaperone present.  HENT:     Head: Normocephalic.  Cardiovascular:     Rate and Rhythm: Normal rate and regular rhythm.  Pulmonary:     Effort: Pulmonary effort is normal. No respiratory distress.     Breath sounds: Normal breath sounds. No wheezing.  Abdominal:     General: There is no distension.     Palpations: Abdomen is soft.     Tenderness: There is no abdominal tenderness. There is no guarding.  Genitourinary:    Exam position: Lithotomy position.     Comments: Pelvic exam: Cervix pink, visually closed, without lesion, scant white creamy discharge, vaginal walls normal, Vaginal lesion resolved. External genitalia erythematous and irritated.    Neurological:     Mental Status: She is alert.  Psychiatric:        Mood and Affect: Mood normal.        Behavior: Behavior normal.        Thought Content: Thought content normal.    Assessment & Plan:   1. Vaginal irritation - will treat for yeast infection based on clinical symptoms and physical examination, cervicovaginal ancillary swab pending and will manage accordingly.  - Cervicovaginal ancillary only( [facility]) - terconazole (TERAZOL 7) 0.4 % vaginal cream; Place 1 applicator vaginally at bedtime. Rub excess on labia. Use for seven days  Dispense: 45 g; Refill: 0  2. Screening for cervical cancer - patient has not had pap according to chart since 2012, pap obtained today  - [facility]   [facility], CNM 11/01/2019 3:33 PM

## 2019-11-02 LAB — CERVICOVAGINAL ANCILLARY ONLY
Bacterial Vaginitis (gardnerella): NEGATIVE
Candida Glabrata: NEGATIVE
Candida Vaginitis: POSITIVE — AB
Comment: NEGATIVE
Comment: NEGATIVE
Comment: NEGATIVE

## 2019-11-02 LAB — CYTOLOGY - PAP
Comment: NEGATIVE
Diagnosis: NEGATIVE
High risk HPV: NEGATIVE

## 2020-11-18 IMAGING — US US MFM OB FOLLOW-UP
1 series · 14 of 28 positions shown · non-contrast
Comparison: none

[Series 1: us mfm ob follow-up · 14 of 50 slices shown]
[im 2/50]
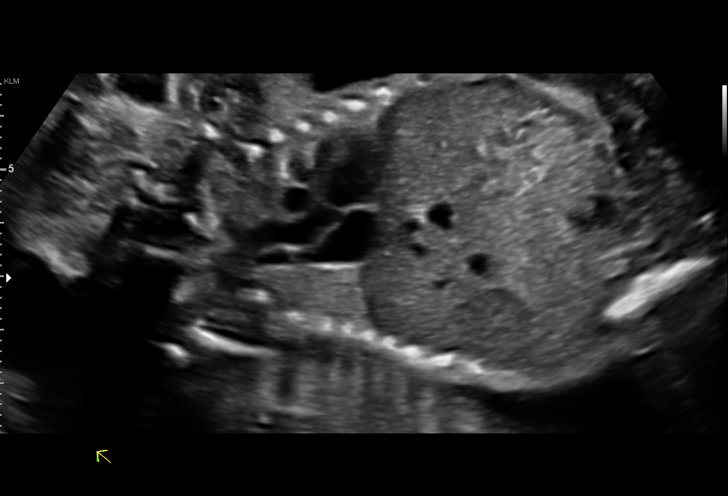
[im 6/50]
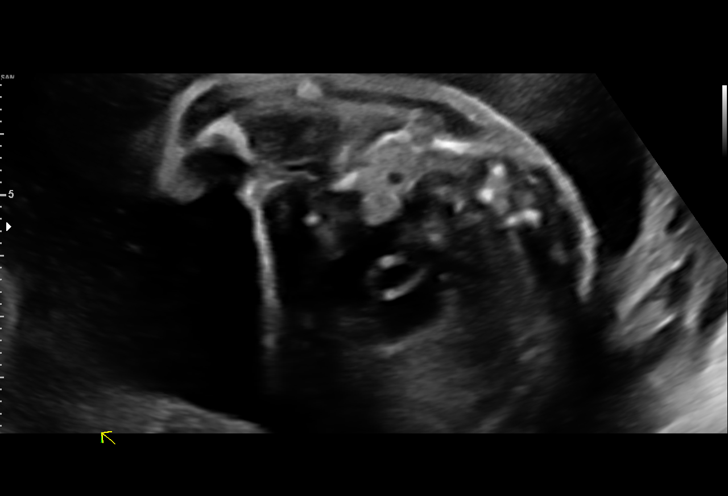
[im 10/50]
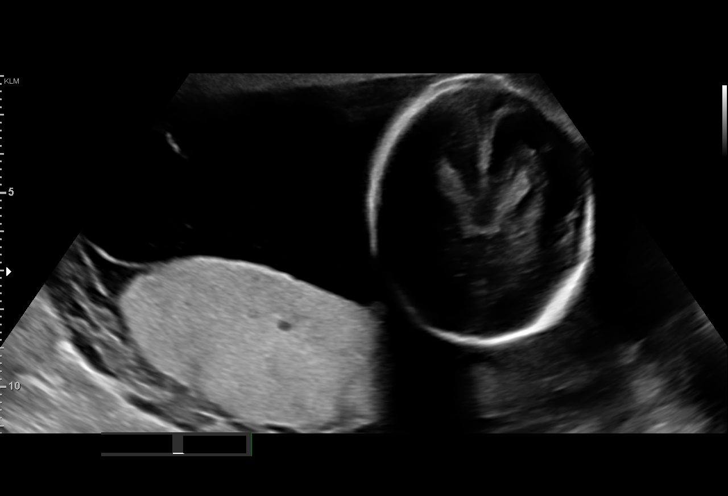
[im 13/50]
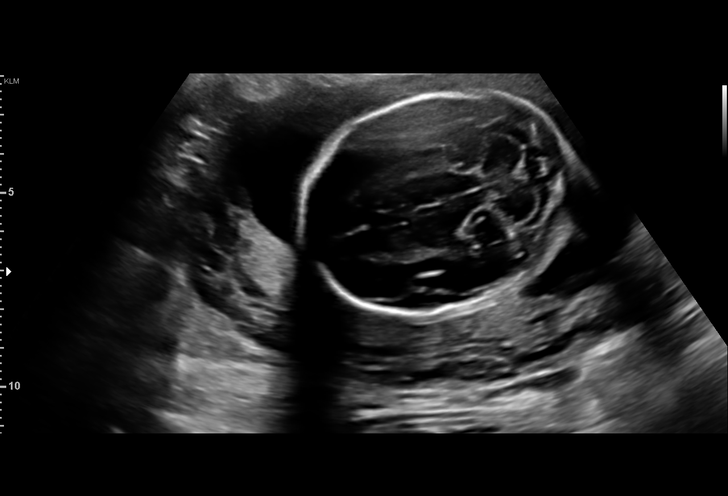
[im 17/50]
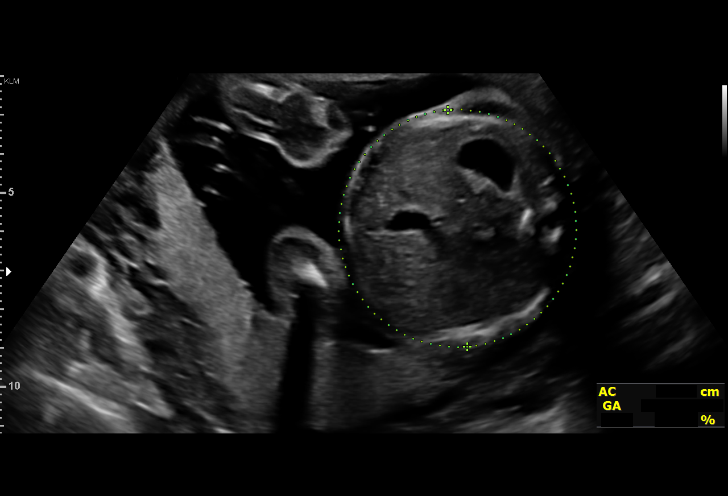
[im 20/50]
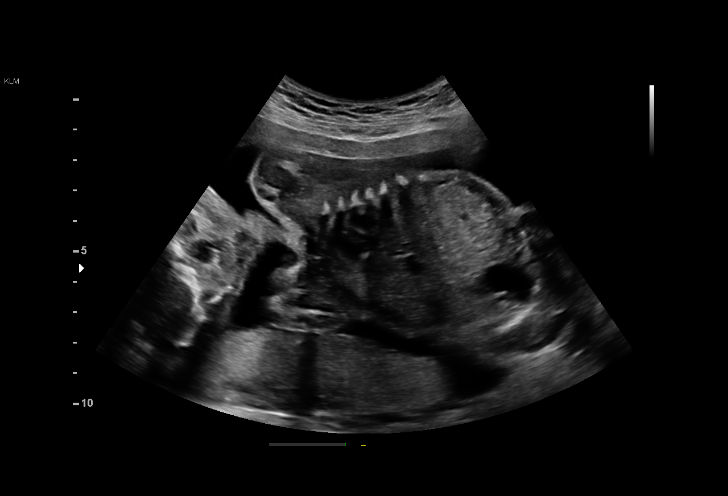
[im 24/50]
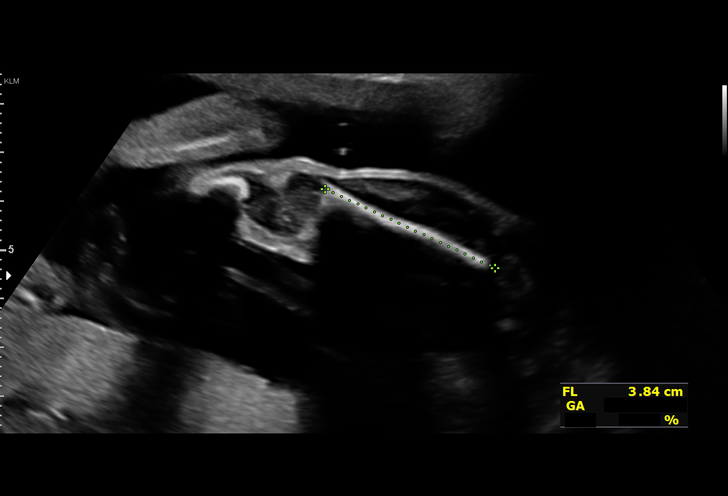
[im 28/50]
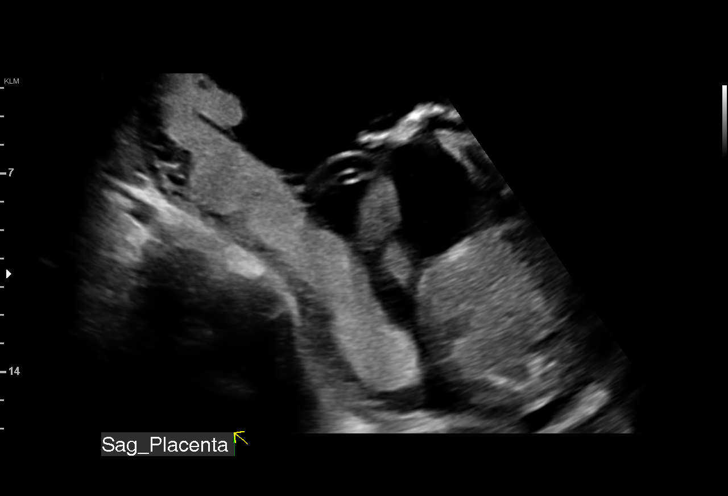
[im 31/50]
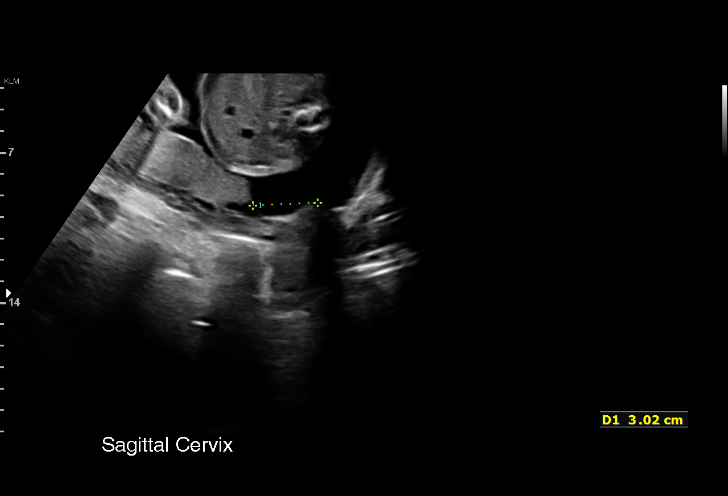
[im 35/50]
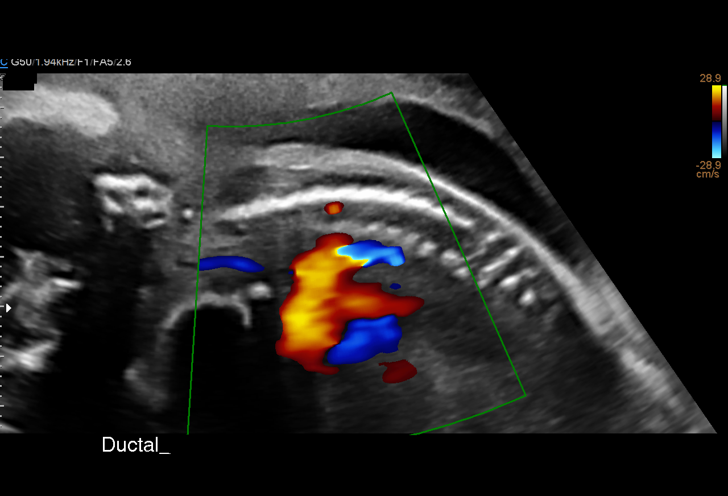
[im 39/50]
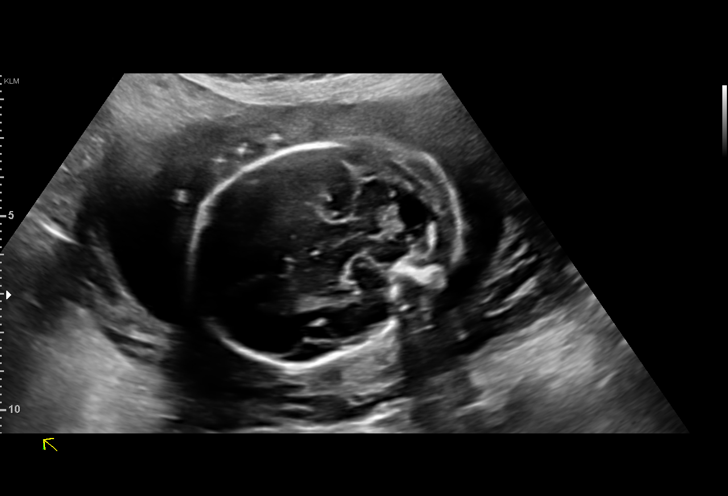
[im 42/50]
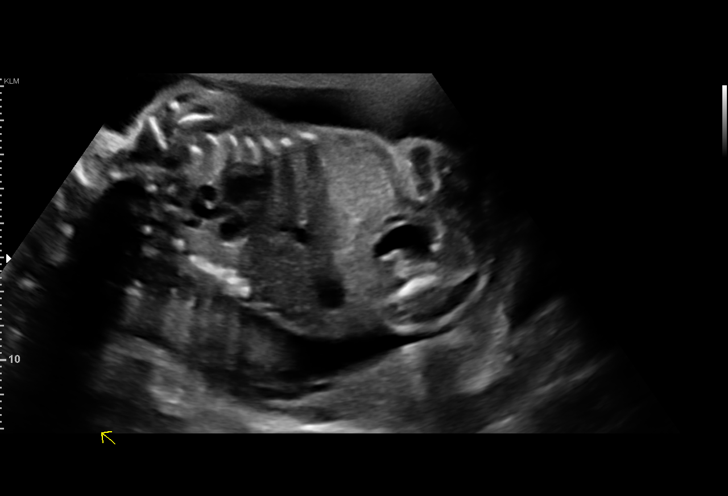
[im 46/50]
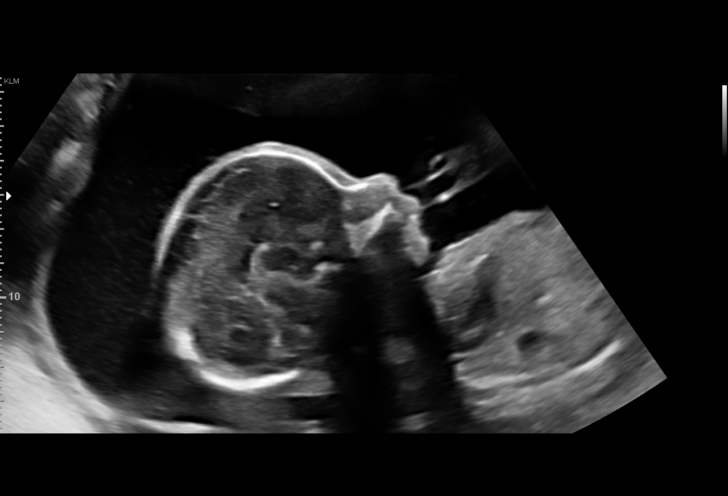
[im 50/50]
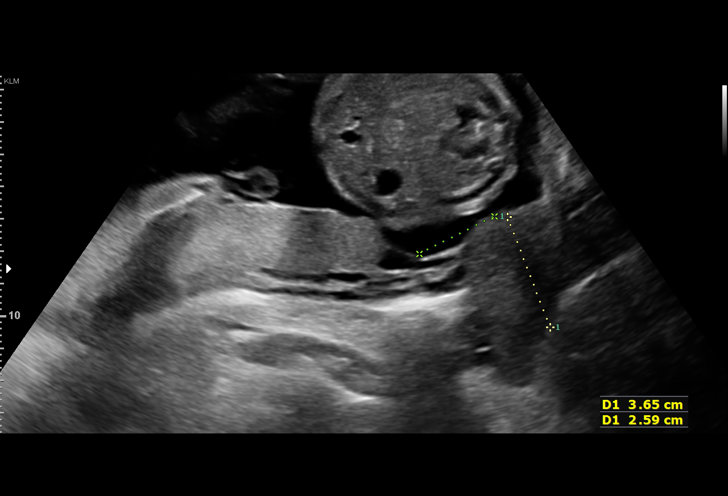

[14 of 28 positions shown; findings below may reference images not displayed]

for [REDACTED]care

                                                       NASTOUPIL
 ----------------------------------------------------------------------

 ----------------------------------------------------------------------
Indications

  Antenatal follow-up for nonvisualized fetal
  anatomy
  23 weeks gestation of pregnancy
  History of cesarean delivery, currently
  pregnant X 2 with VBAC x 1
  Poor obstetric history: Prior fetal
  macrosomia, antepartum (54lbs with
  shoulder dystocia)
 ----------------------------------------------------------------------
Fetal Evaluation

 Num Of Fetuses:          1
 Fetal Heart              134
 Rate(bpm):
 Cardiac Activity:        Observed
 Presentation:            Frank breech
 Placenta:                Posterior
 P. Cord Insertion:       Visualized

 Amniotic Fluid
 AFI FV:      Within normal limits

                             Largest Pocket(cm)
                             5
Biometry

 BPD:      54.9  mm     G. Age:  22w 5d         35  %    CI:         71.64  %    70 - 86
                                                         FL/HC:       18.6  %    19.2 -
 HC:      206.5  mm     G. Age:  22w 5d         26  %    HC/AC:       1.08       1.05 -
 AC:      191.6  mm     G. Age:  23w 6d         70  %    FL/BPD:      69.9  %    71 - 87
 FL:       38.4  mm     G. Age:  22w 2d         19  %    FL/AC:       20.0  %    20 - 24

 Est. FW:     566   g      1 lb 4 oz     56  %
                    m
OB History

 Gravidity:    6         Term:   2        Prem:   1         SAB:   2
 Living:       4
Gestational Age

 U/S Today:     22w 6d                                        EDD:    10/09/18
 Best:          23w 0d     Det. By:  Early Ultrasound         EDD:    10/08/18
                                     (03/12/18)
Anatomy

 Cranium:               Appears normal         Aortic Arch:            Appears normal
 Cavum:                 Appears normal         Ductal Arch:            Appears normal
 Ventricles:            Appears normal         Diaphragm:              Appears normal
 Choroid Plexus:        Appears normal         Stomach:                Appears normal,
                                                                       left sided
 Cerebellum:            Appears normal         Abdomen:                Appears normal
 Posterior Fossa:       Appears normal         Abdominal Wall:         Previously seen
 Nuchal Fold:           Previously seen        Cord Vessels:           Previously seen
 Face:                  Appears normal         Kidneys:                Appear normal
                        (orbits and profile)
 Lips:                  Appears normal         Bladder:                Appears normal
 Thoracic:              Appears normal         Spine:                  Previously seen
 Heart:                 Appears normal         Upper Extremities:      Previously seen
                        (4CH, axis, and
                        situs
 RVOT:                  Appears normal         Lower Extremities:      Previously seen
 LVOT:                  Appears normal

 Other:  Fetus appears to be female. Heels previously visualized. Lenses
         previously visualized. Technically difficult due to fetal position.
Cervix Uterus Adnexa

 Cervix
 Length:            3.6  cm.
 Normal appearance by transabdominal scan.
Impression

 Normal interval growth.
Recommendations

 Follow up as clincally indicated.
